# Patient Record
Sex: Female | Born: 1985 | Race: White | Hispanic: No | Marital: Married | Smoking: Never smoker
Health system: Southern US, Community
[De-identification: ages and names within clinical notes are randomized; demographics above are authoritative.]

## PROBLEM LIST (undated history)

## (undated) DIAGNOSIS — IMO0002 Reserved for concepts with insufficient information to code with codable children: Secondary | ICD-10-CM

## (undated) DIAGNOSIS — Z9889 Other specified postprocedural states: Secondary | ICD-10-CM

## (undated) DIAGNOSIS — L0591 Pilonidal cyst without abscess: Secondary | ICD-10-CM

## (undated) DIAGNOSIS — J302 Other seasonal allergic rhinitis: Secondary | ICD-10-CM

## (undated) DIAGNOSIS — R112 Nausea with vomiting, unspecified: Secondary | ICD-10-CM

## (undated) DIAGNOSIS — F419 Anxiety disorder, unspecified: Secondary | ICD-10-CM

## (undated) HISTORY — DX: Anxiety disorder, unspecified: F41.9

## (undated) HISTORY — PX: WISDOM TOOTH EXTRACTION: SHX21

## (undated) HISTORY — DX: Reserved for concepts with insufficient information to code with codable children: IMO0002

## (undated) HISTORY — PX: COLPOSCOPY: SHX161

---
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
## 2004-02-23 HISTORY — PX: SHOULDER SURGERY: SHX246

## 2004-05-05 ENCOUNTER — Other Ambulatory Visit: Admission: RE | Admit: 2004-05-05 | Discharge: 2004-05-05 | Payer: Self-pay | Admitting: Gynecology

## 2004-08-07 ENCOUNTER — Other Ambulatory Visit: Admission: RE | Admit: 2004-08-07 | Discharge: 2004-08-07 | Payer: Self-pay | Admitting: Gynecology

## 2005-05-12 ENCOUNTER — Other Ambulatory Visit: Admission: RE | Admit: 2005-05-12 | Discharge: 2005-05-12 | Payer: Self-pay | Admitting: Gynecology

## 2005-11-16 ENCOUNTER — Encounter: Admission: RE | Admit: 2005-11-16 | Discharge: 2005-11-16 | Payer: Self-pay | Admitting: Sports Medicine

## 2005-11-19 ENCOUNTER — Ambulatory Visit: Payer: Self-pay | Admitting: Sports Medicine

## 2006-05-11 ENCOUNTER — Ambulatory Visit: Payer: Self-pay | Admitting: Internal Medicine

## 2006-05-16 ENCOUNTER — Other Ambulatory Visit: Admission: RE | Admit: 2006-05-16 | Discharge: 2006-05-16 | Payer: Self-pay | Admitting: Gynecology

## 2006-09-12 ENCOUNTER — Ambulatory Visit: Payer: Self-pay | Admitting: Internal Medicine

## 2006-09-14 ENCOUNTER — Telehealth: Payer: Self-pay | Admitting: Internal Medicine

## 2006-11-25 DIAGNOSIS — R519 Headache, unspecified: Secondary | ICD-10-CM | POA: Insufficient documentation

## 2006-11-25 DIAGNOSIS — R51 Headache: Secondary | ICD-10-CM

## 2007-01-24 ENCOUNTER — Ambulatory Visit: Payer: Self-pay | Admitting: Internal Medicine

## 2007-01-24 DIAGNOSIS — B079 Viral wart, unspecified: Secondary | ICD-10-CM | POA: Insufficient documentation

## 2007-02-27 ENCOUNTER — Ambulatory Visit: Payer: Self-pay | Admitting: Internal Medicine

## 2007-03-30 ENCOUNTER — Ambulatory Visit: Payer: Self-pay | Admitting: Internal Medicine

## 2007-03-30 DIAGNOSIS — M1990 Unspecified osteoarthritis, unspecified site: Secondary | ICD-10-CM | POA: Insufficient documentation

## 2007-05-25 ENCOUNTER — Other Ambulatory Visit: Admission: RE | Admit: 2007-05-25 | Discharge: 2007-05-25 | Payer: Self-pay | Admitting: Gynecology

## 2008-02-02 ENCOUNTER — Other Ambulatory Visit: Admission: RE | Admit: 2008-02-02 | Discharge: 2008-02-02 | Payer: Self-pay | Admitting: Gynecology

## 2008-02-02 ENCOUNTER — Ambulatory Visit: Payer: Self-pay | Admitting: Women's Health

## 2008-02-02 ENCOUNTER — Encounter: Payer: Self-pay | Admitting: Women's Health

## 2008-09-04 ENCOUNTER — Other Ambulatory Visit: Admission: RE | Admit: 2008-09-04 | Discharge: 2008-09-04 | Payer: Self-pay | Admitting: Gynecology

## 2008-09-04 ENCOUNTER — Encounter: Payer: Self-pay | Admitting: Women's Health

## 2008-09-04 ENCOUNTER — Ambulatory Visit: Payer: Self-pay | Admitting: Women's Health

## 2009-02-24 ENCOUNTER — Telehealth: Payer: Self-pay | Admitting: Internal Medicine

## 2009-02-24 ENCOUNTER — Ambulatory Visit: Payer: Self-pay | Admitting: Internal Medicine

## 2009-02-24 DIAGNOSIS — J0110 Acute frontal sinusitis, unspecified: Secondary | ICD-10-CM

## 2009-02-24 LAB — CONVERTED CEMR LAB: Rapid Strep: NEGATIVE

## 2009-02-27 ENCOUNTER — Other Ambulatory Visit: Admission: RE | Admit: 2009-02-27 | Discharge: 2009-02-27 | Payer: Self-pay | Admitting: Gynecology

## 2009-02-27 ENCOUNTER — Ambulatory Visit: Payer: Self-pay | Admitting: Women's Health

## 2009-09-05 ENCOUNTER — Ambulatory Visit: Payer: Self-pay | Admitting: Women's Health

## 2009-09-05 ENCOUNTER — Other Ambulatory Visit: Admission: RE | Admit: 2009-09-05 | Discharge: 2009-09-05 | Payer: Self-pay | Admitting: Gynecology

## 2009-09-19 ENCOUNTER — Ambulatory Visit: Payer: Self-pay | Admitting: Gynecology

## 2010-03-24 NOTE — Assessment & Plan Note (Signed)
Summary: sore throat and cough/bmw   Vital Signs:  Patient profile:   25 year old female Height:      70 inches Weight:      175 pounds BMI:     25.20 Temp:     98.2 degrees F oral Pulse rate:   76 / minute Resp:     14 per minute BP sitting:   110 / 70  (left arm)  Vitals Entered By: Willy Eddy, LPN (February 24, 2009 4:21 PM) CC: c/o sore throawt, low grade fever, and head congestion, URI symptoms   CC:  c/o sore throawt, low grade fever, and head congestion, and URI symptoms.  History of Present Illness:  URI Symptoms      This is a 25 year old woman who presents with URI symptoms.  The patient reports nasal congestion and earache, but denies clear nasal discharge, purulent nasal discharge, sore throat, dry cough, productive cough, and sick contacts.  The patient denies fever, low-grade fever (<100.5 degrees), fever of 100.5-103 degrees, fever of 103.1-104 degrees, fever to >104 degrees, stiff neck, dyspnea, wheezing, rash, vomiting, diarrhea, use of an antipyretic, and response to antipyretic.  The patient also reports itchy throat, headache, and muscle aches.  The patient denies the following risk factors for Strep sinusitis: unilateral facial pain, unilateral nasal discharge, poor response to decongestant, double sickening, tooth pain, Strep exposure, tender adenopathy, and absence of cough.    Preventive Screening-Counseling & Management  Alcohol-Tobacco     Smoking Status: never  Allergies: No Known Drug Allergies  Past History:  Family History: Last updated: 11/25/2006 Family History Breast cancer 1st degree relative <50 Family History of Colon CA 1st degree relative <60 Family History Diabetes 1st degree relative Family History Hypertension Family History Lung cancer Family History of Cardiovascular disorder  Social History: Last updated: 11/25/2006 Single Never Smoked Alcohol use-no Drug use-no Regular exercise-yes  Risk Factors: Exercise: yes  (11/25/2006)  Risk Factors: Smoking Status: never (02/24/2009)  Past medical, surgical, family and social histories (including risk factors) reviewed, and no changes noted (except as noted below).  Past Medical History: Reviewed history from 03/30/2007 and no changes required. Allergies Sinusitis Headache Anxiety Osteoarthritis  Past Surgical History: Reviewed history from 11/25/2006 and no changes required. Rotator cuff repair  Family History: Reviewed history from 11/25/2006 and no changes required. Family History Breast cancer 1st degree relative <50 Family History of Colon CA 1st degree relative <60 Family History Diabetes 1st degree relative Family History Hypertension Family History Lung cancer Family History of Cardiovascular disorder  Social History: Reviewed history from 11/25/2006 and no changes required. Single Never Smoked Alcohol use-no Drug use-no Regular exercise-yes  Review of Systems       The patient complains of hoarseness, prolonged cough, and headaches.  The patient denies anorexia, fever, weight loss, weight gain, vision loss, decreased hearing, chest pain, syncope, dyspnea on exertion, peripheral edema, hemoptysis, abdominal pain, melena, hematochezia, severe indigestion/heartburn, hematuria, incontinence, genital sores, muscle weakness, suspicious skin lesions, transient blindness, difficulty walking, depression, unusual weight change, abnormal bleeding, enlarged lymph nodes, angioedema, breast masses, and testicular masses.    Physical Exam  General:  Well-developed,well-nourished,in no acute distress; alert,appropriate and cooperative throughout examination Head:  normocephalic and atraumatic.   Eyes:  pupils equal and pupils round.   Ears:  R ear normal and L ear normal.   Nose:  no external deformity and no nasal discharge.   Neck:  No deformities, masses, or tenderness noted. Lungs:  normal respiratory effort and no wheezes.   Heart:   normal rate and regular rhythm.   Abdomen:  soft, non-tender, and normal bowel sounds.   Msk:  normal ROM and no joint tenderness.     Impression & Recommendations:  Problem # 1:  ACUTE FRONTAL SINUSITIS (ICD-461.1)  Instructed on treatment. Call if symptoms persist or worsen.   Her updated medication list for this problem includes:    Azithromycin 250 Mg Tabs (Azithromycin) .[name]..[name] Two by mouth now the one by mouth daily for 4 additional days  Complete Medication List: 1)  Zoloft 25 Mg Tabs (Sertraline hcl) .[name].. 1 once daily 2)  Ortho Tri-cyclen (28) 0.035 Mg Tabs (Norgestimate-ethinyl estradiol) .... As directed 3)  Azithromycin 250 Mg Tabs (Azithromycin) .... Two by mouth now the one by mouth daily for 4 additional days  Other Orders: Rapid Strep (16109)  Patient Instructions: 1)  get 14 zyrtec d one by mouth two times a day 2)  take the full course of the antibiotic Prescriptions: AZITHROMYCIN 250 MG TABS (AZITHROMYCIN) two by mouth now the one by mouth daily for 4 additional days  #6 x 0   Entered and Authorized by:   Stacie Glaze MD   Signed by:   Stacie Glaze MD on 02/24/2009   Method used:   Electronically to        [facility] [phone]* (retail)       [address]       [place], [place]  [address]       Ph: [phone] or [phone]       Fax: [phone]   RxID:   [id number]   Laboratory Results    Other Tests  Rapid Strep: negative Comments: Rita Ohara  February 24, 2009 4:08 PM   Kit Test Internal QC: Negative   (Normal Range: Negative)

## 2010-03-24 NOTE — Progress Notes (Signed)
Summary: Needs to be worked in   Advice worker from Patient Call back at [phone]   Caller: Patient Summary of Call: Pt thinks she has sinus infection or strep throat, would like to be seen this afternoon. Initial call taken by: Trixie Dredge,  February 24, 2009 12:10 PM  Follow-up for Phone Call        acute quick work in may come at 4 pm go to lab for strept first Follow-up by: Stacie Glaze MD,  February 24, 2009 1:02 PM  Additional Follow-up for Phone Call Additional follow up Details #1::        pt ionformed Additional Follow-up by: Willy Eddy, LPN,  February 24, 2009 1:07 PM

## 2010-03-24 NOTE — Assessment & Plan Note (Signed)
Summary: DRY SKIN/RASH AROUND CUTICLE BIL THUMBS/NN   Vital Signs:  Patient Profile:   25 Years Old Female Weight:      171 pounds Temp:     97.7 degrees F oral BP sitting:   106 / 60  (right arm)  Vitals Entered By: Raechel Ache, RN (September 12, 2006 9:32 AM)             Is Patient Diabetic? No   Procedure Note  Wart Removal: Indication: inflamed lesion Consent signed: no    Location: left hand    # lesions removed: 6    Technique: cryotherapy    Comment: 6 periungual warts from about her left index finger and second and third fingers of the right hand were treated with cryotherapy  Additional Instructions: call if signs of infection   Chief Complaint:  Rash/abnormal skin around cuticles on thumbs.  History of Present Illness: patients in today complaining of several lesions about the fingers. Clinical exam revealed a several periungual warts      Risk Factors:  Tobacco use:  never    Physical Exam  Skin:     several periungual warts were identified    Impression & Recommendations:  Problem # 1:  periungual warts treated with cryotherapy   Patient Instructions: 1)  call a signs of infection.  Return p.r.n.

## 2010-05-01 ENCOUNTER — Ambulatory Visit: Payer: Self-pay | Admitting: Gynecology

## 2010-05-12 ENCOUNTER — Other Ambulatory Visit (HOSPITAL_COMMUNITY)
Admission: RE | Admit: 2010-05-12 | Discharge: 2010-05-12 | Disposition: A | Discharge status: Home / Self Care | Payer: BC Managed Care – PPO | Source: Ambulatory Visit | Attending: Gynecology | Admitting: Gynecology

## 2010-05-12 ENCOUNTER — Ambulatory Visit (INDEPENDENT_AMBULATORY_CARE_PROVIDER_SITE_OTHER): Payer: BC Managed Care – PPO | Admitting: Gynecology

## 2010-05-12 ENCOUNTER — Other Ambulatory Visit: Payer: Self-pay | Admitting: Gynecology

## 2010-05-12 ENCOUNTER — Telehealth: Payer: Self-pay | Admitting: *Deleted

## 2010-05-12 ENCOUNTER — Other Ambulatory Visit: Payer: Self-pay | Admitting: *Deleted

## 2010-05-12 DIAGNOSIS — R87619 Unspecified abnormal cytological findings in specimens from cervix uteri: Secondary | ICD-10-CM | POA: Insufficient documentation

## 2010-05-12 DIAGNOSIS — R42 Dizziness and giddiness: Secondary | ICD-10-CM

## 2010-05-12 DIAGNOSIS — N870 Mild cervical dysplasia: Secondary | ICD-10-CM

## 2010-05-12 MED ORDER — SCOPOLAMINE 1 MG/3DAYS TD PT72: 1.0000 | MEDICATED_PATCH | TRANSDERMAL | Status: DC

## 2010-05-12 NOTE — Telephone Encounter (Signed)
incomplete phone number from today--last ov 1-11--requesting scopalamine patches for 2 week cruise

## 2010-05-12 NOTE — Telephone Encounter (Signed)
May have number 5

## 2010-05-12 NOTE — Telephone Encounter (Signed)
Last ov 02-2009---requesting scopalamine patches for 2 week cruise

## 2010-07-06 ENCOUNTER — Ambulatory Visit (INDEPENDENT_AMBULATORY_CARE_PROVIDER_SITE_OTHER): Payer: BC Managed Care – PPO | Admitting: Women's Health

## 2010-07-06 DIAGNOSIS — N898 Other specified noninflammatory disorders of vagina: Secondary | ICD-10-CM

## 2010-07-06 DIAGNOSIS — B373 Candidiasis of vulva and vagina: Secondary | ICD-10-CM

## 2010-09-07 ENCOUNTER — Other Ambulatory Visit (HOSPITAL_COMMUNITY)
Admission: RE | Admit: 2010-09-07 | Discharge: 2010-09-07 | Disposition: A | Discharge status: Home / Self Care | Payer: BC Managed Care – PPO | Source: Ambulatory Visit | Attending: Gynecology | Admitting: Gynecology

## 2010-09-07 ENCOUNTER — Encounter (INDEPENDENT_AMBULATORY_CARE_PROVIDER_SITE_OTHER): Payer: BC Managed Care – PPO | Admitting: Women's Health

## 2010-09-07 ENCOUNTER — Other Ambulatory Visit: Payer: Self-pay | Admitting: Women's Health

## 2010-09-07 DIAGNOSIS — Z01419 Encounter for gynecological examination (general) (routine) without abnormal findings: Secondary | ICD-10-CM

## 2010-09-07 DIAGNOSIS — R809 Proteinuria, unspecified: Secondary | ICD-10-CM

## 2010-09-07 DIAGNOSIS — Z113 Encounter for screening for infections with a predominantly sexual mode of transmission: Secondary | ICD-10-CM

## 2010-09-07 DIAGNOSIS — Z124 Encounter for screening for malignant neoplasm of cervix: Secondary | ICD-10-CM | POA: Insufficient documentation

## 2010-09-28 ENCOUNTER — Other Ambulatory Visit: Payer: Self-pay | Admitting: *Deleted

## 2010-09-28 MED ORDER — NORGESTIMATE-ETH ESTRADIOL 0.25-35 MG-MCG PO TABS: 1.0000 | ORAL_TABLET | Freq: Every day | ORAL | Status: DC

## 2010-09-28 MED ORDER — NORGESTIM-ETH ESTRAD TRIPHASIC 0.18/0.215/0.25 MG-25 MCG PO TABS: 1.0000 | ORAL_TABLET | Freq: Every day | ORAL | Status: DC

## 2010-09-28 NOTE — Telephone Encounter (Signed)
Addended by: Venora Maples on: 09/28/2010 04:55 PM   Modules accepted: Orders

## 2011-02-24 ENCOUNTER — Ambulatory Visit (INDEPENDENT_AMBULATORY_CARE_PROVIDER_SITE_OTHER): Payer: BC Managed Care – PPO | Admitting: Women's Health

## 2011-02-24 ENCOUNTER — Encounter: Payer: Self-pay | Admitting: Women's Health

## 2011-02-24 DIAGNOSIS — N870 Mild cervical dysplasia: Secondary | ICD-10-CM

## 2011-02-24 NOTE — Progress Notes (Signed)
Patient ID: [name], female   DOB: 1986-01-26, 26 y.o.   MRN: [id number] Presents for repeat Pap. 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 Last 2 Paps normal. Contracepting on Ortho Tri-Cyclen without complaint/same partner. Gardasil completed in 07  Exam: External genitalia  within normal limits speculum exam cervix is pink without lesion or discharge repeat Pap taken. Bimanual no CMT or adnexal fullness or tenderness.  Plan: If Pap normal will resume to annual screening.

## 2011-03-18 ENCOUNTER — Ambulatory Visit (INDEPENDENT_AMBULATORY_CARE_PROVIDER_SITE_OTHER): Payer: BC Managed Care – PPO | Admitting: Gynecology

## 2011-03-18 ENCOUNTER — Encounter: Payer: Self-pay | Admitting: Gynecology

## 2011-03-18 DIAGNOSIS — K602 Anal fissure, unspecified: Secondary | ICD-10-CM

## 2011-03-18 MED ORDER — FLUCONAZOLE 200 MG PO TABS: 200.0000 mg | ORAL_TABLET | Freq: Every day | ORAL | Status: AC

## 2011-03-18 MED ORDER — CEPHALEXIN 500 MG PO CAPS: 500.0000 mg | ORAL_CAPSULE | Freq: Four times a day (QID) | ORAL | Status: AC

## 2011-03-18 NOTE — Progress Notes (Signed)
Patient presents with approximately one-month history of upper gluteal fold irritation. She thought it was due to wearing her thong and stopped wearing it. She's been treating the area with a combination of Vaseline, hydrocortisone cream and Neosporin type ointment. It seems to have progressively gotten worse and now she noticed some bleeding and presents for evaluation. She has no fevers, chills or constitutional symptoms. No GI symptoms such as diarrhea, abdominal cramping or constipation. No urinary symptoms. She currently is on her menses.  Exam was [name] chaperone present Perineum with fairly deep fissure superior to the anus in the gluteal fold approximately 2 cm in length and 1 cm deep with apparent fresh granulation tissue lining the sides. Some inflammatory changes in the fold above this. All areas moist and red although not consistent with cellulitis but more irritation. I probed the area with a Q-tip and there is no obvious fistulous tract or overt draining material.  Rectal exam is normal without evidence of perirectal masses tenderness or abscess. Otherwise external genital exam is normal with several small pimples in the labia majora but no other abnormalities. No inguinal adenopathy or tenderness. Vaginal exam was not performed due to her menses and tampon in place.  Assessment and plan: Impressive gluteal fissure, questionable pilonidal cyst breakdown. No overt fistulous tract to suggest perirectal fistulas. No bowel changes to suggest Crohn's. Will start on Keflex 4 times a day x7 days and sitz baths with hairdryer drying afterwards 4 times a day. Diflucan daily for 5 days to cover cutaneous yeast as it does look moist and red.  Lastly arrange general surgical consult over the next day or so. Patient understands and agrees with the plan.

## 2011-03-18 NOTE — Patient Instructions (Signed)
Take antibiotics as prescribed and do sits baths with hairdryer drying afterwards 4 times daily. Follow up with Gen. Surgical consult as arranged.

## 2011-03-22 ENCOUNTER — Other Ambulatory Visit: Payer: Self-pay | Admitting: *Deleted

## 2011-03-22 ENCOUNTER — Telehealth: Payer: Self-pay | Admitting: *Deleted

## 2011-03-22 DIAGNOSIS — K602 Anal fissure, unspecified: Secondary | ICD-10-CM

## 2011-03-22 NOTE — Telephone Encounter (Signed)
Patient informed information given phone number if needs to reschedule.

## 2011-03-22 NOTE — Telephone Encounter (Signed)
Lm for patient to call.  Info on appt set up with Dr. Luisa Hart on 04/02/11 @ 9:45am.

## 2011-03-22 NOTE — Telephone Encounter (Signed)
Message copied by Libby Maw on Mon Mar 22, 2011  3:04 PM ------      Message from: Colin Broach P      Created: Thu Mar 18, 2011  4:40 PM       Kristyne Woodring, patient needs general surgical referral preferably Friday reference gluteal fissure questionable pilonidal cyst breakdown. They can access my note for the particulars

## 2011-03-31 ENCOUNTER — Telehealth: Payer: Self-pay | Admitting: *Deleted

## 2011-03-31 NOTE — Telephone Encounter (Signed)
Pt was called to follow up from her visit 2 weeks ago. She states she is somewhat better. And has finished rx Dr Audie Box gave her and has been doing sitz baths daily and will follow up with surgeon on Friday 04/02/11.  Pt thanked me for calling.

## 2011-04-02 ENCOUNTER — Encounter (INDEPENDENT_AMBULATORY_CARE_PROVIDER_SITE_OTHER): Payer: Self-pay | Admitting: Surgery

## 2011-04-02 ENCOUNTER — Ambulatory Visit (INDEPENDENT_AMBULATORY_CARE_PROVIDER_SITE_OTHER): Payer: BC Managed Care – PPO | Admitting: Surgery

## 2011-04-02 VITALS — BP 108/70 | HR 76 | Temp 98.6°F | Resp 12 | Ht 70.0 in | Wt 204.4 lb

## 2011-04-02 DIAGNOSIS — L988 Other specified disorders of the skin and subcutaneous tissue: Secondary | ICD-10-CM

## 2011-04-02 NOTE — Patient Instructions (Signed)
Pilonidal Cyst A pilonidal cyst occurs when hairs get trapped (ingrown) beneath the skin in the crease between the buttocks over your sacrum (the bone under that crease). Pilonidal cysts are most common in young men with a lot of body hair. When the cyst is ruptured (breaks) or leaking, fluid from the cyst may cause burning and itching. If the cyst becomes infected, it causes a painful swelling filled with pus (abscess). The pus and trapped hairs need to be removed (often by lancing) so that the infection can heal. However, recurrence is common and an operation may be needed to remove the cyst. HOME CARE INSTRUCTIONS   If the cyst was NOT INFECTED:   Keep the area clean and dry. Bathe or shower daily. Wash the area well with a germ-killing soap. Warm tub baths may help prevent infection and help with drainage. Dry the area well with a towel.   Avoid tight clothing to keep area as moisture free as possible.   Keep area between buttocks as free of hair as possible. A depilatory may be used.   If the cyst WAS INFECTED and needed to be drained:   Your caregiver packed the wound with gauze to keep the wound open. This allows the wound to heal from the inside outwards and continue draining.   Return for a wound check in 1 day or as suggested.   If you take tub baths or showers, repack the wound with gauze following them. Sponge baths (at the sink) are a good alternative.   If an antibiotic was ordered to fight the infection, take as directed.   Only take over-the-counter or prescription medicines for pain, discomfort, or fever as directed by your caregiver.   After the drain is removed, use sitz baths for 20 minutes 4 times per day. Clean the wound gently with mild unscented soap, pat dry, and then apply a dry dressing.  SEEK MEDICAL CARE IF:   You have increased pain, swelling, redness, drainage, or bleeding from the area.   You have a fever.   You have muscles aches, dizziness, or a  general ill feeling.  Document Released: 02/06/2000 Document Revised: 10/21/2010 Document Reviewed: 04/05/2008 [employer] Patient Information 2012 [employer]. 

## 2011-04-02 NOTE — Progress Notes (Signed)
Patient ID: [name], female   DOB: 1985-08-21, 26 y.o.   MRN: [id number]  Chief Complaint  Patient presents with  . Other    new pt- eval gluteal fissure    HPI [name] is a 26 y.o. female.   HPIThe patient presents with the complaint of cyst of her tailbone. For one month she had an area over her tailbone and opened and drained. She was placed on antibiotics. She is applying saline to the wound over her tailbone. She has no more pain today. And the drainage stopped. She denies any fever or chills. She is sent at the request of Dr. Lovell Sheehan.  Past Medical History  Diagnosis Date  . LGSIL (low grade squamous intraepithelial dysplasia) 2006    c&b  . History of colposcopy with cervical biopsy 2009    lgsil  . 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 Rubella immune 2010  . Anxiety     Past Surgical History  Procedure Date  . Shoulder surgery 2006    right    Family History  Problem Relation Age of Onset  . Cancer Mother     breast  . Hypertension Father   . Diabetes Maternal Grandmother   . Osteoporosis Maternal Grandmother   . Heart disease Maternal Grandfather   . Diabetes Paternal Grandmother   . Rectal cancer Paternal Grandmother   . Cancer Paternal Grandmother     rectal  . Heart disease Paternal Grandfather   . Lung cancer Paternal Grandfather   . Cancer Paternal Grandfather     lung    Social History History  Substance Use Topics  . Smoking status: Never Smoker   . Smokeless tobacco: Not on file  . Alcohol Use: No    No Known Allergies  Current Outpatient Prescriptions  Medication Sig Dispense Refill  . Cetirizine-Pseudoephedrine (ZYRTEC-D PO) Take by mouth as needed.        . Norgestimate-Ethinyl Estradiol Triphasic (ORTHO TRI-CYCLEN LO) 0.18/0.215/0.25 MG-25 MCG tablet Take 1 tablet by mouth daily.  1 Package  11  . sertraline (ZOLOFT) 50 MG tablet Take 50 mg by mouth daily.          Review of Systems Review of Systems  Constitutional:  Negative for fever, chills and unexpected weight change.  HENT: Negative for hearing loss, congestion, sore throat, trouble swallowing and voice change.   Eyes: Negative for visual disturbance.  Respiratory: Negative for cough and wheezing.   Cardiovascular: Negative for chest pain, palpitations and leg swelling.  Gastrointestinal: Negative for nausea, vomiting, abdominal pain, diarrhea, constipation, blood in stool, abdominal distention and anal bleeding.  Genitourinary: Negative for hematuria, vaginal bleeding and difficulty urinating.  Musculoskeletal: Negative for arthralgias.  Skin: Negative for rash and wound.  Neurological: Negative for seizures, syncope and headaches.  Hematological: Negative for adenopathy. Does not bruise/bleed easily.  Psychiatric/Behavioral: Negative for confusion.    Blood pressure 108/70, pulse 76, temperature 98.6 F (37 C), temperature source Temporal, resp. rate 12, height 5\' 10"  (1.778 m), weight 204 lb 6.4 oz (92.715 kg), last menstrual period 03/18/2011.  Physical Exam Physical Exam  Constitutional: She is oriented to person, place, and time. She appears well-developed and well-nourished.  HENT:  Head: Normocephalic and atraumatic.  Eyes: EOM are normal. Pupils are equal, round, and reactive to light.  Neck: Normal range of motion. Neck supple.  Musculoskeletal: Normal range of motion.  Neurological: She is alert and oriented to person, place, and time.  Skin:  Psychiatric: She has a normal mood and affect. Her behavior is normal. Judgment and thought content normal.    Data Reviewed   Assessment    Pilonidal disease    Plan    Her wound is now open and clean. There is no signs of any active drainage. It is relatively large. I will see her back in 3 weeks to recheck her. This will require excision at some point once he acute inflammation settled down.       [name] A. 04/02/2011, 10:56 AM

## 2011-04-26 ENCOUNTER — Encounter (INDEPENDENT_AMBULATORY_CARE_PROVIDER_SITE_OTHER): Payer: BC Managed Care – PPO | Admitting: Surgery

## 2011-05-17 ENCOUNTER — Encounter (INDEPENDENT_AMBULATORY_CARE_PROVIDER_SITE_OTHER): Payer: BC Managed Care – PPO | Admitting: Surgery

## 2011-06-15 ENCOUNTER — Ambulatory Visit (INDEPENDENT_AMBULATORY_CARE_PROVIDER_SITE_OTHER): Payer: BC Managed Care – PPO | Admitting: Surgery

## 2011-06-15 ENCOUNTER — Encounter (INDEPENDENT_AMBULATORY_CARE_PROVIDER_SITE_OTHER): Payer: Self-pay | Admitting: Surgery

## 2011-06-15 ENCOUNTER — Encounter (INDEPENDENT_AMBULATORY_CARE_PROVIDER_SITE_OTHER): Payer: BC Managed Care – PPO | Admitting: Surgery

## 2011-06-15 DIAGNOSIS — L0591 Pilonidal cyst without abscess: Secondary | ICD-10-CM

## 2011-06-15 NOTE — Patient Instructions (Signed)
Return 1 month

## 2011-06-15 NOTE — Progress Notes (Signed)
Patient ID: [name], female   DOB: [birth year], 26 y.o.   MRN: [id number]  No chief complaint on file.   HPI [name] is a 26 y.o. female.   HPIThe patient presents with the complaint of cyst of her tailbone. For one month she had an area over her tailbone and opened and drained. She was placed on antibiotics. She is applying saline to the wound over her tailbone. She has no more pain today. And the drainage stopped. She denies any fever or chills. She is sent at the request of Dr. Lovell Sheehan. She was seen two months ago.  No pain but continues to have drainage.  Past Medical History  Diagnosis Date  . LGSIL (low grade squamous intraepithelial dysplasia) 2006    c&b  . History of colposcopy with cervical biopsy 2009    lgsil  . 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 Rubella immune 2010  . Anxiety     Past Surgical History  Procedure Date  . Shoulder surgery 2006    right    Family History  Problem Relation Age of Onset  . Cancer Mother     breast  . Hypertension Father   . Diabetes Maternal Grandmother   . Osteoporosis Maternal Grandmother   . Heart disease Maternal Grandfather   . Diabetes Paternal Grandmother   . Rectal cancer Paternal Grandmother   . Cancer Paternal Grandmother     rectal  . Heart disease Paternal Grandfather   . Lung cancer Paternal Grandfather   . Cancer Paternal Grandfather     lung    Social History History  Substance Use Topics  . Smoking status: Never Smoker   . Smokeless tobacco: Not on file  . Alcohol Use: No    No Known Allergies  Current Outpatient Prescriptions  Medication Sig Dispense Refill  . Cetirizine-Pseudoephedrine (ZYRTEC-D PO) Take by mouth as needed.        . Norgestimate-Ethinyl Estradiol Triphasic (ORTHO TRI-CYCLEN LO) 0.18/0.215/0.25 MG-25 MCG tablet Take 1 tablet by mouth daily.  1 Package  11  . sertraline (ZOLOFT) 50 MG tablet Take 50 mg by mouth daily.          Review of Systems Review of Systems    Constitutional: Negative for fever, chills and unexpected weight change.  HENT: Negative for hearing loss, congestion, sore throat, trouble swallowing and voice change.   Eyes: Negative for visual disturbance.  Respiratory: Negative for cough and wheezing.   Cardiovascular: Negative for chest pain, palpitations and leg swelling.  Gastrointestinal: Negative for nausea, vomiting, abdominal pain, diarrhea, constipation, blood in stool, abdominal distention and anal bleeding.  Genitourinary: Negative for hematuria, vaginal bleeding and difficulty urinating.  Musculoskeletal: Negative for arthralgias.  Skin: Negative for rash and wound.  Neurological: Negative for seizures, syncope and headaches.  Hematological: Negative for adenopathy. Does not bruise/bleed easily.  Psychiatric/Behavioral: Negative for confusion.    Blood pressure 110/74, pulse 80, height 5\' 10"  (1.778 m), weight 201 lb 6.4 oz (91.354 kg).  Physical Exam Physical Exam  Constitutional: She is oriented to person, place, and time. She appears well-developed and well-nourished.  HENT:  Head: Normocephalic and atraumatic.  Eyes: EOM are normal. Pupils are equal, round, and reactive to light.  Neck: Normal range of motion. Neck supple.  Musculoskeletal: Normal range of motion.  Neurological: She is alert and oriented to person, place, and time.  Skin:  Multiple sinus tracts on two open wounds in gluteal cleft.  Mild drainage no cellulitis.  Psychiatric: She has a normal mood and affect. Her behavior is normal. Judgment and thought content normal.    Data Reviewed   Assessment    Pilonidal disease    Plan    Return 1 month to set up excision once school year is over.         [name] A. 06/15/2011, 4:48 PM

## 2011-07-20 ENCOUNTER — Encounter (INDEPENDENT_AMBULATORY_CARE_PROVIDER_SITE_OTHER): Payer: Self-pay | Admitting: Surgery

## 2011-07-20 ENCOUNTER — Ambulatory Visit (INDEPENDENT_AMBULATORY_CARE_PROVIDER_SITE_OTHER): Payer: BC Managed Care – PPO | Admitting: Surgery

## 2011-07-20 DIAGNOSIS — L0591 Pilonidal cyst without abscess: Secondary | ICD-10-CM

## 2011-07-20 NOTE — Progress Notes (Signed)
Patient ID: [name], female   DOB: [birth year], 26 y.o.   MRN: [id number]  No chief complaint on file.   HPI [name] is a 25 y.o. female.   HPIThe patient presents with the complaint of cyst of her tailbone. For three  month she had an area over her tailbone and opened and drained. She was placed on antibiotics. She is applying saline to the wound over her tailbone. She has no more pain today. And the drainage stopped. She denies any fever or chills. She is sent at the request of Dr. Lovell Sheehan. She was seen two months ago.  No pain but continues to have drainage.  Past Medical History  Diagnosis Date  . LGSIL (low grade squamous intraepithelial dysplasia) 2006    c&b  . History of colposcopy with cervical biopsy 2009    lgsil  . 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 Rubella immune 2010  . Anxiety     Past Surgical History  Procedure Date  . Shoulder surgery 2006    right    Family History  Problem Relation Age of Onset  . Cancer Mother     breast  . Hypertension Father   . Diabetes Maternal Grandmother   . Osteoporosis Maternal Grandmother   . Heart disease Maternal Grandfather   . Diabetes Paternal Grandmother   . Rectal cancer Paternal Grandmother   . Cancer Paternal Grandmother     rectal  . Heart disease Paternal Grandfather   . Lung cancer Paternal Grandfather   . Cancer Paternal Grandfather     lung    Social History History  Substance Use Topics  . Smoking status: Never Smoker   . Smokeless tobacco: Not on file  . Alcohol Use: No    No Known Allergies  Current Outpatient Prescriptions  Medication Sig Dispense Refill  . Cetirizine-Pseudoephedrine (ZYRTEC-D PO) Take by mouth as needed.        . Norgestimate-Ethinyl Estradiol Triphasic (ORTHO TRI-CYCLEN LO) 0.18/0.215/0.25 MG-25 MCG tablet Take 1 tablet by mouth daily.  1 Package  11  . sertraline (ZOLOFT) 50 MG tablet Take 50 mg by mouth daily.          Review of Systems Review of Systems    Constitutional: Negative for fever, chills and unexpected weight change.  HENT: Negative for hearing loss, congestion, sore throat, trouble swallowing and voice change.   Eyes: Negative for visual disturbance.  Respiratory: Negative for cough and wheezing.   Cardiovascular: Negative for chest pain, palpitations and leg swelling.  Gastrointestinal: Negative for nausea, vomiting, abdominal pain, diarrhea, constipation, blood in stool, abdominal distention and anal bleeding.  Genitourinary: Negative for hematuria, vaginal bleeding and difficulty urinating.  Musculoskeletal: Negative for arthralgias.  Skin: Negative for rash and wound.  Neurological: Negative for seizures, syncope and headaches.  Hematological: Negative for adenopathy. Does not bruise/bleed easily.  Psychiatric/Behavioral: Negative for confusion.    Blood pressure 120/82, pulse 80, temperature 97.8 F (36.6 C), height 5\' 10"  (1.778 m), weight 200 lb (90.719 kg).  Physical Exam Physical Exam  Constitutional: She is oriented to person, place, and time. She appears well-developed and well-nourished.  HENT:  Head: Normocephalic and atraumatic.  Eyes: EOM are normal. Pupils are equal, round, and reactive to light.  Neck: Normal range of motion. Neck supple.  Musculoskeletal: Normal range of motion.  Neurological: She is alert and oriented to person, place, and time.  Skin:  Multiple sinus tracts on two open wounds in gluteal cleft.  Mild drainage no cellulitis.   Psychiatric: She has a normal mood and affect. Her behavior is normal. Judgment and thought content normal.    Data Reviewed   Assessment    Pilonidal disease    Plan    Excision pilonidal cyst.   Risks include bleeding, infection, poor wound healing, organ injury, the need for more surgery, and recurrence. She agrees to proceed.       Karene Bracken A. 07/20/2011, 5:19 PM

## 2011-07-20 NOTE — Patient Instructions (Signed)
Pilonidal Cyst A pilonidal cyst occurs when hairs get trapped (ingrown) beneath the skin in the crease between the buttocks over your sacrum (the bone under that crease). Pilonidal cysts are most common in young men with a lot of body hair. When the cyst is ruptured (breaks) or leaking, fluid from the cyst may cause burning and itching. If the cyst becomes infected, it causes a painful swelling filled with pus (abscess). The pus and trapped hairs need to be removed (often by lancing) so that the infection can heal. However, recurrence is common and an operation may be needed to remove the cyst. HOME CARE INSTRUCTIONS   If the cyst was NOT INFECTED:   Keep the area clean and dry. Bathe or shower daily. Wash the area well with a germ-killing soap. Warm tub baths may help prevent infection and help with drainage. Dry the area well with a towel.   Avoid tight clothing to keep area as moisture free as possible.   Keep area between buttocks as free of hair as possible. A depilatory may be used.   If the cyst WAS INFECTED and needed to be drained:   Your caregiver packed the wound with gauze to keep the wound open. This allows the wound to heal from the inside outwards and continue draining.   Return for a wound check in 1 day or as suggested.   If you take tub baths or showers, repack the wound with gauze following them. Sponge baths (at the sink) are a good alternative.   If an antibiotic was ordered to fight the infection, take as directed.   Only take over-the-counter or prescription medicines for pain, discomfort, or fever as directed by your caregiver.   After the drain is removed, use sitz baths for 20 minutes 4 times per day. Clean the wound gently with mild unscented soap, pat dry, and then apply a dry dressing.  SEEK MEDICAL CARE IF:   You have increased pain, swelling, redness, drainage, or bleeding from the area.   You have a fever.   You have muscles aches, dizziness, or a  general ill feeling.  Document Released: 02/06/2000 Document Revised: 01/28/2011 Document Reviewed: 04/05/2008 Exit[employer] Patient Information 2012 [employer]. 

## 2011-08-23 DIAGNOSIS — L0591 Pilonidal cyst without abscess: Secondary | ICD-10-CM

## 2011-08-23 DIAGNOSIS — IMO0002 Reserved for concepts with insufficient information to code with codable children: Secondary | ICD-10-CM

## 2011-08-23 HISTORY — DX: Reserved for concepts with insufficient information to code with codable children: IMO0002

## 2011-08-23 HISTORY — DX: Pilonidal cyst without abscess: L05.91

## 2011-09-10 ENCOUNTER — Other Ambulatory Visit (HOSPITAL_COMMUNITY)
Admission: RE | Admit: 2011-09-10 | Discharge: 2011-09-10 | Disposition: A | Discharge status: Home / Self Care | Payer: BC Managed Care – PPO | Source: Ambulatory Visit | Attending: Women's Health | Admitting: Women's Health

## 2011-09-10 ENCOUNTER — Ambulatory Visit (INDEPENDENT_AMBULATORY_CARE_PROVIDER_SITE_OTHER): Payer: BC Managed Care – PPO | Admitting: Women's Health

## 2011-09-10 ENCOUNTER — Encounter: Payer: Self-pay | Admitting: Women's Health

## 2011-09-10 VITALS — BP 110/70 | Ht 68.75 in | Wt 201.0 lb

## 2011-09-10 DIAGNOSIS — Z01419 Encounter for gynecological examination (general) (routine) without abnormal findings: Secondary | ICD-10-CM | POA: Insufficient documentation

## 2011-09-10 DIAGNOSIS — IMO0001 Reserved for inherently not codable concepts without codable children: Secondary | ICD-10-CM

## 2011-09-10 DIAGNOSIS — R87810 Cervical high risk human papillomavirus (HPV) DNA test positive: Secondary | ICD-10-CM | POA: Insufficient documentation

## 2011-09-10 DIAGNOSIS — Z309 Encounter for contraceptive management, unspecified: Secondary | ICD-10-CM

## 2011-09-10 DIAGNOSIS — L0591 Pilonidal cyst without abscess: Secondary | ICD-10-CM | POA: Insufficient documentation

## 2011-09-10 LAB — CBC WITH DIFFERENTIAL/PLATELET
Basophils Relative: 0 % (ref 0–1)
HCT: 37.7 % (ref 36.0–46.0)
Hemoglobin: 12.9 g/dL (ref 12.0–15.0)
MCHC: 34.2 g/dL (ref 30.0–36.0)
Monocytes Absolute: 0.9 10*3/uL (ref 0.1–1.0)
Monocytes Relative: 9 % (ref 3–12)
Neutro Abs: 6 10*3/uL (ref 1.7–7.7)

## 2011-09-10 MED ORDER — NORGESTIMATE-ETH ESTRADIOL 0.25-35 MG-MCG PO TABS: 1.0000 | ORAL_TABLET | Freq: Every day | ORAL | Status: DC

## 2011-09-10 NOTE — Patient Instructions (Signed)

## 2011-09-10 NOTE — Progress Notes (Addendum)
[name] 1985/04/27 [id number]    History:    The patient presents for annual exam. Monthly cycles lasting 4-5 days/Ortho Tri-Cyclen/same partner. History of LGSIL 06 and 09 with C&B, normal Paps after. Gardasil completed. Tdap current. Diagnosed with pilonidal cyst a few months ago/surgery scheduled for 7-31.   Past medical history, past surgical history, family history and social history were all reviewed and documented in the EPIC chart. Mother-history of breast cancer age 32. 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  Divorce final. Rubella immune.   ROS:  A  ROS was performed and pertinent positives and negatives are included in the history.  Exam:  Filed Vitals:   09/10/11 1353  BP: 110/70    General appearance:  Normal Head/Neck:  Normal, without cervical or supraclavicular adenopathy. Thyroid:  Symmetrical, normal in size, without palpable masses or nodularity. Respiratory  Effort:  Normal  Auscultation:  Clear without wheezing or rhonchi Cardiovascular  Auscultation:  Regular rate, without rubs, murmurs or gallops  Edema/varicosities:  Not grossly evident Abdominal  Soft,nontender, without masses, guarding or rebound.  Liver/spleen:  No organomegaly noted  Hernia:  None appreciated  Skin  Inspection:  Grossly normal  Palpation:  Grossly normal Neurologic/psychiatric  Orientation:  Normal with appropriate conversation.  Mood/affect:  Normal  Genitourinary    Breasts: Examined lying and sitting.     Right: Without masses, retractions, discharge or axillary adenopathy.     Left: Without masses, retractions, discharge or axillary adenopathy.   Inguinal/mons:  Normal without inguinal adenopathy  External genitalia:  Normal  BUS/Urethra/Skene's glands:  Normal  Bladder:  Normal  Vagina:  Normal  Cervix:  Normal  Uterus:  Normal in size, shape and contour.  Midline and mobile  Adnexa/parametria:     Rt: Without masses or tenderness.   Lt: Without masses  or tenderness.  Anus and perineum: Pilonidal cyst.   Assessment/Plan:  26 y.o.  DWF G0 for annual exam with pilonidal cyst.  Pilonidal cyst/surgery 09-22-11 Hx LGSIL with C&B 06 & 09 Family hx breast cancer  Plan: CBC, UA. Pap with HR HPV done, new screening guidelines reviewed. Upcoming surgery for pilonidal cyst is scheduled for 7-31. SBE's, daily MVI, calcium rich diet, exercise, use of sunscreen encouraged. Plans to start mammograms in 2 years due to family history. Discussed BRCA testing, patient's mother declines.Orthotricyclin prescription, proper use reviewed slight risks for blood clots and strokes.  Condoms encouraged until permanent partner.  Harrington Challenger [facility], 2:46 PM 09/10/2011

## 2011-09-11 LAB — URINALYSIS W MICROSCOPIC + REFLEX CULTURE
Bacteria, UA: NONE SEEN
Crystals: NONE SEEN
Ketones, ur: NEGATIVE mg/dL
Leukocytes, UA: NEGATIVE
Nitrite: NEGATIVE
Specific Gravity, Urine: 1.019 (ref 1.005–1.030)
Squamous Epithelial / LPF: NONE SEEN
Urobilinogen, UA: 0.2 mg/dL (ref 0.0–1.0)

## 2011-09-16 ENCOUNTER — Encounter (HOSPITAL_BASED_OUTPATIENT_CLINIC_OR_DEPARTMENT_OTHER): Payer: Self-pay | Admitting: *Deleted

## 2011-09-21 ENCOUNTER — Ambulatory Visit (INDEPENDENT_AMBULATORY_CARE_PROVIDER_SITE_OTHER): Payer: BC Managed Care – PPO | Admitting: Gynecology

## 2011-09-21 ENCOUNTER — Encounter: Payer: Self-pay | Admitting: Gynecology

## 2011-09-21 DIAGNOSIS — R87612 Low grade squamous intraepithelial lesion on cytologic smear of cervix (LGSIL): Secondary | ICD-10-CM

## 2011-09-21 DIAGNOSIS — IMO0002 Reserved for concepts with insufficient information to code with codable children: Secondary | ICD-10-CM

## 2011-09-21 NOTE — Progress Notes (Signed)
Patient ID: [name], female   DOB: Jan 05, 1986, 26 y.o.   MRN: [id number] Patient presents with history of low-grade SIL in both 2006 and 2009. Follow up Pap smears since have all been normal until most recently with Pap smear 08/2011 showing low-grade SIL with positive high-risk HPV. Patient is scheduled for pilonidal cyst surgery tomorrow.  Exam of [name] Asst. External BUS vagina large gaping open area upper gluteal fold. Cervix grossly normal. Uterus normal size midline mobile nontender. Adnexa without masses or tenderness.  Colposcopy after acetic acid cleanse is adequate with small external os. No abnormalities visualized. ECC performed.  Physical Exam  Genitourinary:     Assessment and plan: 1. Low-grade SIL Pap smear positive high-risk HPV.  Colposcopy adequate with no abnormalities. ECC performed. Assuming low-grade are normal plan repeat Pap in one year. 2. Or cysts. Patient has surgery scheduled tomorrow.

## 2011-09-21 NOTE — Patient Instructions (Signed)
Follow up for biopsy results.

## 2011-09-22 ENCOUNTER — Encounter (HOSPITAL_BASED_OUTPATIENT_CLINIC_OR_DEPARTMENT_OTHER): Payer: Self-pay | Admitting: *Deleted

## 2011-09-22 ENCOUNTER — Ambulatory Visit (HOSPITAL_BASED_OUTPATIENT_CLINIC_OR_DEPARTMENT_OTHER)
Admission: RE | Admit: 2011-09-22 | Discharge: 2011-09-22 | Disposition: A | Discharge status: Home / Self Care | Payer: BC Managed Care – PPO | Source: Ambulatory Visit | Attending: Surgery | Admitting: Surgery

## 2011-09-22 ENCOUNTER — Encounter (HOSPITAL_BASED_OUTPATIENT_CLINIC_OR_DEPARTMENT_OTHER): Payer: Self-pay | Admitting: Certified Registered"

## 2011-09-22 ENCOUNTER — Encounter (HOSPITAL_BASED_OUTPATIENT_CLINIC_OR_DEPARTMENT_OTHER): Payer: Self-pay | Admitting: Surgery

## 2011-09-22 ENCOUNTER — Ambulatory Visit (HOSPITAL_BASED_OUTPATIENT_CLINIC_OR_DEPARTMENT_OTHER): Payer: BC Managed Care – PPO | Admitting: Certified Registered"

## 2011-09-22 ENCOUNTER — Encounter (HOSPITAL_BASED_OUTPATIENT_CLINIC_OR_DEPARTMENT_OTHER)
Admission: RE | Disposition: A | Discharge status: Home / Self Care | Payer: Self-pay | Source: Ambulatory Visit | Attending: Surgery

## 2011-09-22 DIAGNOSIS — L732 Hidradenitis suppurativa: Secondary | ICD-10-CM | POA: Insufficient documentation

## 2011-09-22 DIAGNOSIS — L0591 Pilonidal cyst without abscess: Secondary | ICD-10-CM

## 2011-09-22 DIAGNOSIS — B079 Viral wart, unspecified: Secondary | ICD-10-CM

## 2011-09-22 DIAGNOSIS — R51 Headache: Secondary | ICD-10-CM

## 2011-09-22 DIAGNOSIS — F411 Generalized anxiety disorder: Secondary | ICD-10-CM

## 2011-09-22 DIAGNOSIS — M1990 Unspecified osteoarthritis, unspecified site: Secondary | ICD-10-CM

## 2011-09-22 DIAGNOSIS — J0110 Acute frontal sinusitis, unspecified: Secondary | ICD-10-CM

## 2011-09-22 HISTORY — PX: PILONIDAL CYST EXCISION: SHX744

## 2011-09-22 HISTORY — DX: Nausea with vomiting, unspecified: R11.2

## 2011-09-22 HISTORY — DX: Other specified postprocedural states: Z98.890

## 2011-09-22 HISTORY — DX: Other seasonal allergic rhinitis: J30.2

## 2011-09-22 HISTORY — DX: Pilonidal cyst without abscess: L05.91

## 2011-09-22 LAB — POCT HEMOGLOBIN-HEMACUE: Hemoglobin: 14.2 g/dL (ref 12.0–15.0)

## 2011-09-22 SURGERY — EXCISION, PILONIDAL CYST, EXTENSIVE
Anesthesia: General | Site: Coccyx | Wound class: Dirty or Infected

## 2011-09-22 MED ORDER — PROPOFOL 10 MG/ML IV EMUL
INTRAVENOUS | Status: DC | PRN
  Administered 2011-09-22: 40 mg via INTRAVENOUS
  Administered 2011-09-22: 60 mg via INTRAVENOUS
  Administered 2011-09-22: 300 mg via INTRAVENOUS

## 2011-09-22 MED ORDER — LACTATED RINGERS IV SOLN
INTRAVENOUS | Status: DC
  Administered 2011-09-22 (×2): via INTRAVENOUS

## 2011-09-22 MED ORDER — LIDOCAINE HCL (CARDIAC) 20 MG/ML IV SOLN
INTRAVENOUS | Status: DC | PRN
  Administered 2011-09-22: 60 mg via INTRAVENOUS
  Administered 2011-09-22: 40 mg via INTRAVENOUS

## 2011-09-22 MED ORDER — CEFAZOLIN SODIUM-DEXTROSE 2-3 GM-% IV SOLR
2.0000 g | INTRAVENOUS | Status: AC
  Administered 2011-09-22: 2 g via INTRAVENOUS

## 2011-09-22 MED ORDER — DOXYCYCLINE HYCLATE 100 MG PO TABS: 100.0000 mg | ORAL_TABLET | Freq: Two times a day (BID) | ORAL | Status: AC

## 2011-09-22 MED ORDER — HYDROMORPHONE HCL PF 1 MG/ML IJ SOLN
0.2500 mg | INTRAMUSCULAR | Status: DC | PRN
  Administered 2011-09-22 (×2): 0.5 mg via INTRAVENOUS

## 2011-09-22 MED ORDER — TRAMADOL HCL 50 MG PO TABS: 50.0000 mg | ORAL_TABLET | Freq: Four times a day (QID) | ORAL | Status: AC | PRN

## 2011-09-22 MED ORDER — MIDAZOLAM HCL 5 MG/5ML IJ SOLN
INTRAMUSCULAR | Status: DC | PRN
  Administered 2011-09-22: 2 mg via INTRAVENOUS

## 2011-09-22 MED ORDER — SCOPOLAMINE 1 MG/3DAYS TD PT72
1.0000 | MEDICATED_PATCH | TRANSDERMAL | Status: DC
  Administered 2011-09-22: 1.5 mg via TRANSDERMAL

## 2011-09-22 MED ORDER — SUCCINYLCHOLINE CHLORIDE 20 MG/ML IJ SOLN
INTRAMUSCULAR | Status: DC | PRN
  Administered 2011-09-22: 160 mg via INTRAVENOUS

## 2011-09-22 MED ORDER — GLYCOPYRROLATE 0.2 MG/ML IJ SOLN
INTRAMUSCULAR | Status: DC | PRN
  Administered 2011-09-22: 0.1 mg via INTRAVENOUS

## 2011-09-22 MED ORDER — BUPIVACAINE-EPINEPHRINE 0.25% -1:200000 IJ SOLN
INTRAMUSCULAR | Status: DC | PRN
  Administered 2011-09-22: 20 mL

## 2011-09-22 MED ORDER — FENTANYL CITRATE 0.05 MG/ML IJ SOLN
INTRAMUSCULAR | Status: DC | PRN
  Administered 2011-09-22 (×2): 100 ug via INTRAVENOUS

## 2011-09-22 MED ORDER — MIDAZOLAM HCL 2 MG/2ML IJ SOLN: 0.5000 mg | Freq: Once | INTRAMUSCULAR | Status: DC | PRN

## 2011-09-22 MED ORDER — PROMETHAZINE HCL 25 MG/ML IJ SOLN: 6.2500 mg | INTRAMUSCULAR | Status: DC | PRN

## 2011-09-22 MED ORDER — MEPERIDINE HCL 25 MG/ML IJ SOLN: 6.2500 mg | INTRAMUSCULAR | Status: DC | PRN

## 2011-09-22 MED ORDER — OXYCODONE-ACETAMINOPHEN 5-325 MG PO TABS: 1.0000 | ORAL_TABLET | ORAL | Status: AC | PRN

## 2011-09-22 MED ORDER — ONDANSETRON HCL 4 MG/2ML IJ SOLN
INTRAMUSCULAR | Status: DC | PRN
  Administered 2011-09-22: 4 mg via INTRAVENOUS

## 2011-09-22 MED ORDER — CHLORHEXIDINE GLUCONATE 4 % EX LIQD: 1.0000 "application " | Freq: Once | CUTANEOUS | Status: DC

## 2011-09-22 MED ORDER — DEXAMETHASONE SODIUM PHOSPHATE 4 MG/ML IJ SOLN
INTRAMUSCULAR | Status: DC | PRN
  Administered 2011-09-22: 10 mg via INTRAVENOUS

## 2011-09-22 SURGICAL SUPPLY — 42 items
BLADE HEX COATED 2.75 (ELECTRODE) ×2 IMPLANT
BLADE SURG 10 STRL SS (BLADE) ×2 IMPLANT
BLADE SURG 15 STRL LF DISP TIS (BLADE) ×1 IMPLANT
BLADE SURG 15 STRL SS (BLADE) ×2
CANISTER SUCTION 1500CC (MISCELLANEOUS) ×2 IMPLANT
CHLORAPREP W/TINT 26ML (MISCELLANEOUS) ×2 IMPLANT
CLEANER CAUTERY TIP 5X5 PAD (MISCELLANEOUS) ×1 IMPLANT
CLOTH BEACON ORANGE TIMEOUT ST (SAFETY) ×2 IMPLANT
COVER MAYO STAND STRL (DRAPES) ×2 IMPLANT
COVER TABLE BACK 60X90 (DRAPES) ×2 IMPLANT
DECANTER SPIKE VIAL GLASS SM (MISCELLANEOUS) IMPLANT
DRAIN PENROSE 1/2X12 LTX STRL (WOUND CARE) ×2 IMPLANT
DRAPE LAPAROTOMY T 102X78X121 (DRAPES) ×2 IMPLANT
DRAPE UTILITY XL STRL (DRAPES) ×2 IMPLANT
DRSG PAD ABDOMINAL 8X10 ST (GAUZE/BANDAGES/DRESSINGS) ×2 IMPLANT
ELECT REM PT RETURN 9FT ADLT (ELECTROSURGICAL) ×2
ELECTRODE REM PT RTRN 9FT ADLT (ELECTROSURGICAL) ×1 IMPLANT
GAUZE SPONGE 4X4 12PLY STRL LF (GAUZE/BANDAGES/DRESSINGS) IMPLANT
GLOVE BIO SURGEON STRL SZ 6.5 (GLOVE) ×2 IMPLANT
GLOVE BIO SURGEON STRL SZ8 (GLOVE) ×2 IMPLANT
GLOVE BIO SURGEON STRL SZ8.5 (GLOVE) ×2 IMPLANT
GLOVE BIOGEL PI IND STRL 8.5 (GLOVE) ×1 IMPLANT
GLOVE BIOGEL PI INDICATOR 8.5 (GLOVE) ×1
GOWN PREVENTION PLUS XLARGE (GOWN DISPOSABLE) ×2 IMPLANT
NEEDLE HYPO 25X1 1.5 SAFETY (NEEDLE) ×2 IMPLANT
NS IRRIG 1000ML POUR BTL (IV SOLUTION) ×2 IMPLANT
PACK BASIN DAY SURGERY FS (CUSTOM PROCEDURE TRAY) ×2 IMPLANT
PAD CLEANER CAUTERY TIP 5X5 (MISCELLANEOUS) ×1
PENCIL BUTTON HOLSTER BLD 10FT (ELECTRODE) ×2 IMPLANT
SLEEVE SCD COMPRESS KNEE MED (MISCELLANEOUS) ×2 IMPLANT
SPONGE GAUZE 4X4 12PLY (GAUZE/BANDAGES/DRESSINGS) ×2 IMPLANT
SPONGE LAP 4X18 X RAY DECT (DISPOSABLE) ×2 IMPLANT
STAPLER VISISTAT 35W (STAPLE) IMPLANT
SUT ETHILON 2 0 FSLX (SUTURE) ×2 IMPLANT
SYR CONTROL 10ML LL (SYRINGE) ×2 IMPLANT
TAPE CLOTH 3X10 TAN LF (GAUZE/BANDAGES/DRESSINGS) ×2 IMPLANT
TOWEL OR 17X24 6PK STRL BLUE (TOWEL DISPOSABLE) ×4 IMPLANT
TOWEL OR NON WOVEN STRL DISP B (DISPOSABLE) ×2 IMPLANT
TUBE CONNECTING 20X1/4 (TUBING) ×2 IMPLANT
UNDERPAD 30X30 INCONTINENT (UNDERPADS AND DIAPERS) ×2 IMPLANT
WATER STERILE IRR 1000ML POUR (IV SOLUTION) ×2 IMPLANT
YANKAUER SUCT BULB TIP NO VENT (SUCTIONS) ×2 IMPLANT

## 2011-09-22 NOTE — Anesthesia Procedure Notes (Signed)
Procedure Name: Intubation Date/Time: 09/22/2011 8:45 AM Performed by: Verlan Friends Pre-anesthesia Checklist: Patient identified, Emergency Drugs available, Suction available, Patient being monitored and Timeout performed Patient Re-evaluated:Patient Re-evaluated prior to inductionOxygen Delivery Method: Circle System Utilized Preoxygenation: Pre-oxygenation with 100% oxygen Intubation Type: IV induction Ventilation: Mask ventilation without difficulty Laryngoscope Size: Miller and 3 Grade View: Grade I Tube type: Oral Tube size: 7.0 mm Number of attempts: 1 Airway Equipment and Method: stylet and oral airway Placement Confirmation: ETT inserted through vocal cords under direct vision,  positive ETCO2 and breath sounds checked- equal and bilateral Secured at: 21 cm Tube secured with: Tape Dental Injury: Teeth and Oropharynx as per pre-operative assessment

## 2011-09-22 NOTE — Interval H&P Note (Signed)
History and Physical Interval Note:  09/22/2011 8:21 AM  [name]  has presented today for surgery, with the diagnosis of pilonidal cyst  The various methods of treatment have been discussed with the patient and family. After consideration of risks, benefits and other options for treatment, the patient has consented to  Procedure(s) (LRB): CYST EXCISION PILONIDAL EXTENSIVE (N/A) as a surgical intervention .  The patient's history has been reviewed, patient examined, no change in status, stable for surgery.  I have reviewed the patient's chart and labs.  Questions were answered to the patient's satisfaction.     Bronsyn Shappell A.

## 2011-09-22 NOTE — Anesthesia Preprocedure Evaluation (Signed)
Anesthesia Evaluation  Patient identified by MRN, date of birth, ID band Patient awake    Reviewed: Allergy & Precautions, H&P , NPO status , Patient's Chart, lab work & pertinent test results  History of Anesthesia Complications (+) PONV  Airway Mallampati: I TM Distance: >3 FB Neck ROM: Full    Dental No notable dental hx. (+) Teeth Intact and Dental Advisory Given   Pulmonary neg pulmonary ROS,  breath sounds clear to auscultation  Pulmonary exam normal       Cardiovascular negative cardio ROS  Rhythm:Regular Rate:Normal     Neuro/Psych negative neurological ROS     GI/Hepatic negative GI ROS, Neg liver ROS,   Endo/Other  negative endocrine ROS  Renal/GU negative Renal ROS     Musculoskeletal   Abdominal (+) + obese,   Peds  Hematology negative hematology ROS (+)   Anesthesia Other Findings   Reproductive/Obstetrics                           Anesthesia Physical Anesthesia Plan  ASA: I  Anesthesia Plan: General   Post-op Pain Management:    Induction: Intravenous  Airway Management Planned: Oral ETT  Additional Equipment:   Intra-op Plan:   Post-operative Plan: Extubation in OR  Informed Consent: I have reviewed the patients History and Physical, chart, labs and discussed the procedure including the risks, benefits and alternatives for the proposed anesthesia with the patient or authorized representative who has indicated his/her understanding and acceptance.   Dental advisory given  Plan Discussed with: CRNA and Surgeon  Anesthesia Plan Comments: (Plan routine monitors, GETA )        Anesthesia Quick Evaluation

## 2011-09-22 NOTE — Anesthesia Postprocedure Evaluation (Signed)
  Anesthesia Post-op Note  Patient: [name]  Procedure(s) Performed: Procedure(s) (LRB): CYST EXCISION PILONIDAL EXTENSIVE (N/A)  Patient Location: PACU  Anesthesia Type: General  Level of Consciousness: awake, alert  and oriented  Airway and Oxygen Therapy: Patient Spontanous Breathing  Post-op Pain: none  Post-op Assessment: Post-op Vital signs reviewed, Patient's Cardiovascular Status Stable, Respiratory Function Stable, Patent Airway, No signs of Nausea or vomiting and Pain level controlled  Post-op Vital Signs: Reviewed and stable  Complications: No apparent anesthesia complications

## 2011-09-22 NOTE — Op Note (Signed)
Preoperative diagnosis: 4 x 5 cm pilonidal open wound chronic  Postop diagnosis: Same plus perianal hidradenitis suppurativa chronic  Procedure: Debridement of pilonidal wound and closure over half inch Penrose drain  Surgeon: Harriette Bouillon M.D.  Anesthesia: Gen. Endotracheal anesthesia with 0.25% Sensorcaine local with epinephrine  Specimen: Cavity and debridement to pathology  Drains: One half inch Penrose  IV fluids: Thousand cc crystalloid  EBL: Less than 30 cc  Indications for procedure: The patient is a 26 year old female with a chronic pilonidal wound. It has been open and draining in the past has not healed over the last 6 months. I recommended debridement of this in the office to help this heal since she had chronic granulation tissue in the wound measures 4 x 5 cm. There is no erythema but there is some fibrinous exudate from it. Risk of bleeding, infection, injury to the sphincter muscles, poor wound healing, the need for the surgery, injury to nerves, arteries, veins, and the need for more extensive procedures discussed. She to proceed.  Description of procedure: Patient was met in the holding area and questions are answered. She's taken back to the operating room and placed supine where general anesthesia was initiated. She's and placed prone and appropriate padded. The gluteal cleft was examined and an open 4 x 5 cm was present but she also had areas of hidradenitis surrounding the anal canal which was new. No active infection was noted that. This area was prepped and draped in a sterile fashion. She received 2 g of Ancef. Timeout was done. I began to debride the wound. The wound extended down into the external sphincter mechanism. Care is taken to preserve as much of this but there was some erosion of the external sphincter mechanism. She has not had problems with incontinence. This did not track into the rectum. Digital rectal examination was performed and there was no indication  from the wound to the rectum. The wound was copiously irrigated. An attempt to get this to heal, I elected placed a 1/2 inch Penrose drain with 2 separate stab incisions through the wound and secured to the skin with 2-0 nylon. I then used 2-0 nylon to loosely approximate the edges of the wound to facilitate closure. Dry dressings were applied and local anesthesia was infiltrated around the wound. All final counts are found to be correct. The patient was then placed supine extubated taken to recovery in satisfactory condition.

## 2011-09-22 NOTE — Transfer of Care (Signed)
Immediate Anesthesia Transfer of Care Note  Patient: [name]  Procedure(s) Performed: Procedure(s) (LRB): CYST EXCISION PILONIDAL EXTENSIVE (N/A)  Patient Location: PACU  Anesthesia Type: General  Level of Consciousness: awake, alert , oriented and patient cooperative  Airway & Oxygen Therapy: Patient Spontanous Breathing and Patient connected to face mask oxygen  Post-op Assessment: Report given to PACU RN and Post -op Vital signs reviewed and stable  Post vital signs: Reviewed and stable  Complications: No apparent anesthesia complications

## 2011-09-22 NOTE — H&P (Signed)
[name]   MRN: [id number]   Description: 26 year old female  Provider: Dortha Schwalbe., MD  Department: [facility]        Diagnoses     Pilonidal cyst   - Primary    685         Vitals - Last Recorded       BP Pulse Temp Ht Wt BMI    120/82 80 97.8 F (36.6 C) 5\' 10"  (1.778 m) 200 lb (90.719 kg) 28.70 kg/m2       Vitals History Recorded       Progress Notes     Patient ID: [name], female   DOB: 1985/10/07, 26 y.o.   MRN: [id number]   No chief complaint on file.   HPI [name] is a 26 y.o. female.   HPIThe patient presents with the complaint of cyst of her tailbone. For three  month she had an area over her tailbone and opened and drained. She was placed on antibiotics. She is applying saline to the wound over her tailbone. She has no more pain today. And the drainage stopped. She denies any fever or chills. She is sent at the request of Dr. Lovell Sheehan. She was seen two months ago.  No pain but continues to have drainage.    Past Medical History   Diagnosis  Date   .  LGSIL (low grade squamous intraepithelial dysplasia)  2006       c&b   .  History of colposcopy with cervical biopsy  2009       lgsil   .  High risk HPV infection  2009   .  Rubella immune  2010   .  Anxiety         Past Surgical History   Procedure  Date   .  Shoulder surgery  2006       right       Family History   Problem  Relation  Age of Onset   .  Cancer  Mother         breast   .  Hypertension  Father     .  Diabetes  Maternal Grandmother     .  Osteoporosis  Maternal Grandmother     .  Heart disease  Maternal Grandfather     .  Diabetes  Paternal Grandmother     .  Rectal cancer  Paternal Grandmother     .  Cancer  Paternal Grandmother         rectal   .  Heart disease  Paternal Grandfather     .  Lung cancer  Paternal Grandfather     .  Cancer  Paternal Grandfather         lung      Social History History   Substance Use Topics   .  Smoking  status:  Never Smoker    .  Smokeless tobacco:  Not on file   .  Alcohol Use:  No      No Known Allergies    Current Outpatient Prescriptions   Medication  Sig  Dispense  Refill   .  Cetirizine-Pseudoephedrine (ZYRTEC-D PO)  Take by mouth as needed.           .  Norgestimate-Ethinyl Estradiol Triphasic (ORTHO TRI-CYCLEN LO) 0.18/0.215/0.25 MG-25 MCG tablet  Take 1 tablet by mouth daily.   1 Package   11   .  sertraline (ZOLOFT) 50  MG tablet  Take 50 mg by mouth daily.              Review of Systems Review of Systems  Constitutional: Negative for fever, chills and unexpected weight change.  HENT: Negative for hearing loss, congestion, sore throat, trouble swallowing and voice change.   Eyes: Negative for visual disturbance.  Respiratory: Negative for cough and wheezing.   Cardiovascular: Negative for chest pain, palpitations and leg swelling.  Gastrointestinal: Negative for nausea, vomiting, abdominal pain, diarrhea, constipation, blood in stool, abdominal distention and anal bleeding.  Genitourinary: Negative for hematuria, vaginal bleeding and difficulty urinating.  Musculoskeletal: Negative for arthralgias.  Skin: Negative for rash and wound.  Neurological: Negative for seizures, syncope and headaches.  Hematological: Negative for adenopathy. Does not bruise/bleed easily.  Psychiatric/Behavioral: Negative for confusion.      Blood pressure 120/82, pulse 80, temperature 97.8 F (36.6 C), height 5\' 10"  (1.778 m), weight 200 lb (90.719 kg).   Physical Exam Physical Exam  Constitutional: She is oriented to person, place, and time. She appears well-developed and well-nourished.  HENT:   Head: Normocephalic and atraumatic.  Eyes: EOM are normal. Pupils are equal, round, and reactive to light.  Neck: Normal range of motion. Neck supple.  Musculoskeletal: Normal range of motion.  Neurological: She is alert and oriented to person, place, and time.  Skin:  Multiple sinus  tracts on two open wounds in gluteal cleft.  Mild drainage no cellulitis.  Psychiatric: She has a normal mood and affect. Her behavior is normal. Judgment and thought content normal.      Data Reviewed     Assessment Pilonidal disease   Plan Excision pilonidal cyst.   Risks include bleeding, infection, poor wound healing, organ injury, the need for more surgery, and recurrence. She agrees to proceed.       Hillery Bhalla A. 09/22/2011

## 2011-09-23 ENCOUNTER — Encounter (HOSPITAL_BASED_OUTPATIENT_CLINIC_OR_DEPARTMENT_OTHER): Payer: Self-pay | Admitting: Surgery

## 2011-09-27 ENCOUNTER — Telehealth (INDEPENDENT_AMBULATORY_CARE_PROVIDER_SITE_OTHER): Payer: Self-pay | Admitting: General Surgery

## 2011-09-27 NOTE — Telephone Encounter (Signed)
Pt calling to report that her tongue is still numb.  She reported this DOS to recovery nurse; anesthesia stated it was residual to Lidocaine spray used in mouth to begin induction.  Her tongue has remained numb since that time, now POD  #5.  Advised pt to call [facility] and speak with nurse there.  She understands and will comply.

## 2011-10-04 ENCOUNTER — Encounter (INDEPENDENT_AMBULATORY_CARE_PROVIDER_SITE_OTHER): Payer: BC Managed Care – PPO | Admitting: General Surgery

## 2011-10-04 ENCOUNTER — Encounter (INDEPENDENT_AMBULATORY_CARE_PROVIDER_SITE_OTHER): Payer: Self-pay | Admitting: General Surgery

## 2011-10-04 ENCOUNTER — Telehealth (INDEPENDENT_AMBULATORY_CARE_PROVIDER_SITE_OTHER): Payer: Self-pay | Admitting: General Surgery

## 2011-10-04 ENCOUNTER — Ambulatory Visit (INDEPENDENT_AMBULATORY_CARE_PROVIDER_SITE_OTHER): Payer: BC Managed Care – PPO | Admitting: General Surgery

## 2011-10-04 DIAGNOSIS — Z09 Encounter for follow-up examination after completed treatment for conditions other than malignant neoplasm: Secondary | ICD-10-CM

## 2011-10-04 NOTE — Telephone Encounter (Signed)
I CONTACTED MS TUCKER PER DIRECTIONS FROM DR. GERKIN AFTER SPEAKING WITH HER OVER THE WEEKEND/ HER DRAIN APPEARS TO BE MOVING OUT OF POSITION AND INCISION IS SEPARATING. I SCHEDULED HER TO SEE DR. BYERLY IN URGENT OFFICE TODAY PER INSTRUCTIONS/ PT AWARE/GY

## 2011-10-04 NOTE — Progress Notes (Signed)
Subjective:     Patient ID: [name], female   DOB: 1985-09-21, 26 y.o.   MRN: [id number]  HPI 82 yof s/p excision of pilonidal disease by Dr. Luisa Hart a couple weeks ago.  She had penrose drain left for the entire length.  Over past weekend it opened up a little and is causing pain at site of drain.    Review of Systems     Objective:   Physical Exam There is penrose that enters to left at superior aspect of incision and it exits via the lower portion of her incision The top several centimeters have separated but there is no infection    Assessment:     Pilonidal cyst excision     Plan:     There is no infection.  I removed a stitch that pulled through that was bothering her.  I told them this could not be closed and would just have to heal.  She will see Dr. Luisa Hart later this week.

## 2011-10-07 ENCOUNTER — Ambulatory Visit (INDEPENDENT_AMBULATORY_CARE_PROVIDER_SITE_OTHER): Payer: BC Managed Care – PPO | Admitting: Surgery

## 2011-10-07 ENCOUNTER — Encounter (INDEPENDENT_AMBULATORY_CARE_PROVIDER_SITE_OTHER): Payer: Self-pay | Admitting: Surgery

## 2011-10-07 VITALS — BP 126/84 | HR 106 | Temp 97.8°F | Resp 14 | Ht 69.0 in | Wt 197.0 lb

## 2011-10-07 DIAGNOSIS — Z9889 Other specified postprocedural states: Secondary | ICD-10-CM

## 2011-10-07 NOTE — Patient Instructions (Signed)
Return next week for drain removal 

## 2011-10-07 NOTE — Progress Notes (Signed)
Patient returns after excision of complex pilonidal cyst. She was seen ears office this week for stitch removal rather drain. She had some bleeding irritation around drain site. She has a lot of itching but not much pain.  Exam: There is separation and some serous sanguinous drainage from the wound. Drainage tube intact. Minimal skin irritation.  Impression: Status post excision of chronic infected Tylenol cyst  Plan: Return 1 week for drain removal and stitch removal. Continue present care.

## 2011-10-11 NOTE — Progress Notes (Signed)
This encounter was created in error - please disregard.

## 2011-10-15 ENCOUNTER — Encounter (INDEPENDENT_AMBULATORY_CARE_PROVIDER_SITE_OTHER): Payer: Self-pay | Admitting: Surgery

## 2011-10-15 ENCOUNTER — Ambulatory Visit (INDEPENDENT_AMBULATORY_CARE_PROVIDER_SITE_OTHER): Payer: BC Managed Care – PPO | Admitting: Surgery

## 2011-10-15 VITALS — BP 120/78 | HR 106 | Temp 98.2°F | Ht 70.0 in | Wt 202.6 lb

## 2011-10-15 DIAGNOSIS — Z9889 Other specified postprocedural states: Secondary | ICD-10-CM

## 2011-10-15 NOTE — Patient Instructions (Signed)
Continue current care.

## 2011-10-15 NOTE — Progress Notes (Signed)
Pt return in follow up of pilonidal wound.  Drains and sutures removed. There is separation of 2 cm   X 5 cm but wound clean.   No sign  of infection.  Return 2 weeks.

## 2011-11-02 ENCOUNTER — Encounter (INDEPENDENT_AMBULATORY_CARE_PROVIDER_SITE_OTHER): Payer: BC Managed Care – PPO | Admitting: Surgery

## 2011-11-09 ENCOUNTER — Encounter (INDEPENDENT_AMBULATORY_CARE_PROVIDER_SITE_OTHER): Payer: BC Managed Care – PPO | Admitting: Surgery

## 2011-11-18 ENCOUNTER — Encounter (INDEPENDENT_AMBULATORY_CARE_PROVIDER_SITE_OTHER): Payer: Self-pay | Admitting: Surgery

## 2011-11-18 ENCOUNTER — Ambulatory Visit (INDEPENDENT_AMBULATORY_CARE_PROVIDER_SITE_OTHER): Payer: BC Managed Care – PPO | Admitting: Surgery

## 2011-11-18 VITALS — BP 130/82 | HR 72 | Temp 97.3°F | Resp 12 | Ht 70.0 in | Wt 201.2 lb

## 2011-11-18 DIAGNOSIS — Z9889 Other specified postprocedural states: Secondary | ICD-10-CM

## 2011-11-18 NOTE — Progress Notes (Signed)
Patient returns after excision of a large infected pilonidal cyst.  She feels well.  Exam: Mood is approximately 5 x 4 x 2 cm. It is clean. There is some to resection date. No signs of invasive infection.  Impression: Status post excision of infected pilonidal cyst  Plan: Return in one month. Continue application of Neosporin and dry dressing.

## 2011-11-18 NOTE — Patient Instructions (Signed)
Apply triple antibiotic ointment to area.  Return 1 month

## 2011-11-23 ENCOUNTER — Other Ambulatory Visit: Payer: Self-pay | Admitting: *Deleted

## 2011-11-23 MED ORDER — NORGESTIM-ETH ESTRAD TRIPHASIC 0.18/0.215/0.25 MG-25 MCG PO TABS: 1.0000 | ORAL_TABLET | Freq: Every day | ORAL | Status: DC

## 2011-12-14 ENCOUNTER — Ambulatory Visit (INDEPENDENT_AMBULATORY_CARE_PROVIDER_SITE_OTHER): Payer: BC Managed Care – PPO | Admitting: Surgery

## 2011-12-14 ENCOUNTER — Encounter (INDEPENDENT_AMBULATORY_CARE_PROVIDER_SITE_OTHER): Payer: Self-pay | Admitting: Surgery

## 2011-12-14 VITALS — BP 118/72 | HR 86 | Temp 98.0°F | Resp 18 | Ht 69.0 in | Wt 201.2 lb

## 2011-12-14 DIAGNOSIS — Z9889 Other specified postprocedural states: Secondary | ICD-10-CM

## 2011-12-14 NOTE — Patient Instructions (Signed)
Apply neosporin twice a day.  Return 3 weeks.

## 2011-12-14 NOTE — Progress Notes (Signed)
Patient returns after excision of a large infected pilonidal cyst.  She feels well.  Exam: PILONIDAL wound is approximately 5 x 4 x 2 cm. It is clean. There is some exudate but No signs of invasive infection.  Impression: Status post excision of infected pilonidal cyst complex with complex wound.  Plan: Return in one month. Change  application of Neosporin  To BID and dry dressing.

## 2012-01-11 ENCOUNTER — Ambulatory Visit (INDEPENDENT_AMBULATORY_CARE_PROVIDER_SITE_OTHER): Payer: BC Managed Care – PPO | Admitting: Surgery

## 2012-01-11 ENCOUNTER — Encounter (INDEPENDENT_AMBULATORY_CARE_PROVIDER_SITE_OTHER): Payer: Self-pay | Admitting: Surgery

## 2012-01-11 VITALS — BP 112/68 | HR 72 | Temp 97.2°F | Resp 16 | Ht 70.0 in | Wt 204.0 lb

## 2012-01-11 DIAGNOSIS — L988 Other specified disorders of the skin and subcutaneous tissue: Secondary | ICD-10-CM

## 2012-01-11 NOTE — Patient Instructions (Signed)
Continue neosporin

## 2012-01-11 NOTE — Progress Notes (Signed)
Patient returns after excision of a large infected pilonidal cyst.  She feels well. Less drainage.  Exam: PILONIDAL wound is approximately 5 x 3 x 1 cm. It is clean. There is some exudate but No signs of invasive infection.  Impression: Status post excision of infected pilonidal cyst complex with complex wound. There is reduction in wound size.  Plan: Return in one month. Change  application of Neosporin  To BID and dry dressing.

## 2012-02-07 ENCOUNTER — Ambulatory Visit (INDEPENDENT_AMBULATORY_CARE_PROVIDER_SITE_OTHER): Payer: BC Managed Care – PPO | Admitting: Surgery

## 2012-02-07 ENCOUNTER — Encounter (INDEPENDENT_AMBULATORY_CARE_PROVIDER_SITE_OTHER): Payer: Self-pay | Admitting: Surgery

## 2012-02-07 VITALS — BP 124/72 | HR 80 | Temp 97.2°F | Resp 12 | Ht 70.0 in | Wt 201.4 lb

## 2012-02-07 DIAGNOSIS — L988 Other specified disorders of the skin and subcutaneous tissue: Secondary | ICD-10-CM

## 2012-02-07 NOTE — Progress Notes (Signed)
Patient returns after excision of a large infected pilonidal cyst.  She feels well. Less drainage. Fell off chair decorating tree  Exam: PILONIDAL wound is approximately 5 x 2 x 1 cm. It is clean. There is some exudate but No signs of invasive infection.  Impression: Status post excision of infected pilonidal cyst complex with complex wound. There is reduction in wound size by 1 cm compared to last month.  Plan: Return in one month. Continue   application of Neosporin  To BID and dry dressing. Slowly improving.  No need for debridement at this point but progress is slow.

## 2012-02-07 NOTE — Patient Instructions (Signed)
Return 1 month

## 2012-03-06 ENCOUNTER — Ambulatory Visit (INDEPENDENT_AMBULATORY_CARE_PROVIDER_SITE_OTHER): Payer: BC Managed Care – PPO | Admitting: Surgery

## 2012-03-06 ENCOUNTER — Encounter (INDEPENDENT_AMBULATORY_CARE_PROVIDER_SITE_OTHER): Payer: Self-pay | Admitting: Surgery

## 2012-03-06 VITALS — BP 110/78 | HR 88 | Temp 97.3°F | Resp 16 | Ht 70.0 in | Wt 204.2 lb

## 2012-03-06 DIAGNOSIS — L988 Other specified disorders of the skin and subcutaneous tissue: Secondary | ICD-10-CM

## 2012-03-06 NOTE — Patient Instructions (Signed)
Return 1 month

## 2012-03-06 NOTE — Progress Notes (Signed)
Patient returns after excision of a large infected pilonidal cyst.  She feels well. Less drainage. Some pain with BM.    Exam: PILONIDAL wound is approximately 4.5 x 2 x 1 cm. It is clean. There is some exudate but No signs of invasive infection.  Impression: Status post excision of infected pilonidal cyst complex with complex wound. There is reduction in wound size by 1 cm compared to last month.  Plan: Return in one month. Continue   application of Neosporin  To BID and dry dressing. Slowly improving.  No need for debridement at this point but progress is slow.  Add more fiber to diet

## 2012-03-09 ENCOUNTER — Telehealth: Payer: Self-pay | Admitting: Cardiovascular Disease

## 2012-03-09 NOTE — Telephone Encounter (Signed)
Records Received From [facility], Gave to [place] For Pt NP appt 03/24/12   03/09/12/KM

## 2012-03-24 ENCOUNTER — Encounter: Payer: Self-pay | Admitting: Cardiovascular Disease

## 2012-03-24 ENCOUNTER — Ambulatory Visit (INDEPENDENT_AMBULATORY_CARE_PROVIDER_SITE_OTHER): Payer: BC Managed Care – PPO | Admitting: Cardiovascular Disease

## 2012-03-24 VITALS — BP 144/91 | HR 69 | Ht 70.0 in | Wt 205.0 lb

## 2012-03-24 DIAGNOSIS — Z136 Encounter for screening for cardiovascular disorders: Secondary | ICD-10-CM

## 2012-03-24 NOTE — Patient Instructions (Addendum)
Your physician recommends that you schedule a follow-up appointment as needed with Dr. McAlhany  Your physician has requested that you have an echocardiogram. Echocardiography is a painless test that uses sound waves to create images of your heart. It provides your doctor with information about the size and shape of your heart and how well your heart's chambers and valves are working. This procedure takes approximately one hour. There are no restrictions for this procedure.    

## 2012-03-24 NOTE — Progress Notes (Signed)
History of Present Illness: 27 yo female with history of anxiety who is here today for cardiac evaluation. Her father is our patient and is known to have a bicuspid aortic valve with dilated thoracic aorta and recently had aortic valve replacement/thoracic aortic replacement. She has been worried that she could also have a congenital cardiac abnormality and is here today for cardiac screening.    She is feeling well. She has no dyspnea, chest pain, dizziness, near syncope or syncope. She does not drink alcohol, smoke cigarettes or use illicit drugs. 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 She is very active and exercises frequently.   Primary Care Provider: Maryelizabeth Rowan, NP (OB/Gyn)  Past Medical History  Diagnosis Date  . Anxiety   . PONV (postoperative nausea and vomiting)   . Seasonal allergies   . Pilonidal cyst 08/2011    is open and draining  . 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 Shoulder surgery 2006    right  . Wisdom tooth extraction   . Pilonidal cyst excision 09/22/2011    Procedure: CYST EXCISION PILONIDAL EXTENSIVE;  Surgeon: Clovis Pu. Cornett, MD;  Location: [facility];  Service: General;  Laterality: N/A;    Current Outpatient Prescriptions  Medication Sig Dispense Refill  . Cetirizine-Pseudoephedrine (ZYRTEC-D PO) Take 1 tablet by mouth as needed.       . norgestimate-ethinyl estradiol (ORTHO-CYCLEN,SPRINTEC,PREVIFEM) 0.25-35 MG-MCG tablet Take 1 tablet by mouth daily.  1 Package  12  . Norgestimate-Ethinyl Estradiol Triphasic (ORTHO TRI-CYCLEN LO) 0.18/0.215/0.25 MG-25 MCG tab Take 1 tablet by mouth daily.  1 Package  9  . sertraline (ZOLOFT) 50 MG tablet         No Known Allergies  History   Social History  . Marital Status: Single    Spouse Name: Engaged    Number of Children: 0  . Years of Education: N/A   Occupational History  . Special  Ed Teacher [employer]   Social History Main Topics  . Smoking status: Never Smoker   . Smokeless tobacco: Never Used  . Alcohol Use: No  . Drug Use: No  . Sexually Active: Yes   Other Topics Concern  . Not on file   Social History Narrative  . No narrative on file    Family History  Problem Relation Age of Onset  . Cancer Mother     breast  . Anesthesia problems Mother     post-op N/V  . Hypertension Father   . Diabetes Maternal Grandmother   . Osteoporosis Maternal Grandmother   . Heart disease Maternal Grandfather   . Diabetes Paternal Grandmother   . Rectal cancer Paternal Grandmother   . Cancer Paternal Grandmother     rectal  . Heart disease Paternal Grandfather   . Lung cancer Paternal Grandfather   . Cancer Paternal Grandfather     lung  . Heart failure Paternal Grandfather   . Congenital heart disease Father   . Heart attack Maternal Grandfather   . Heart attack Paternal Grandfather     Review of Systems:  As stated in the HPI and otherwise negative.   BP 144/91  Pulse 69  Ht 5\' 10"  (1.778 m)  Wt 205 lb (92.987 kg)  BMI 29.41 kg/m2  Physical Examination: General: Well developed, well nourished, NAD HEENT: OP clear, mucus membranes moist SKIN: warm, dry. No  rashes. Neuro: No focal deficits Musculoskeletal: Muscle strength 5/5 all ext Psychiatric: Mood and affect normal Neck: No JVD, no carotid bruits, no thyromegaly, no lymphadenopathy. Lungs:Clear bilaterally, no wheezes, rhonci, crackles Cardiovascular: Regular rate and rhythm. No murmurs, gallops or rubs. Abdomen:Soft. Bowel sounds present. Non-tender.  Extremities: No lower extremity edema. Pulses are 2 + in the bilateral DP/PT.  EKG: NSR, rate 95 bpm. Non-specific ST abnormality. Overall normal.   Assessment and Plan:   1. Screening cardiovascular examination: She has a family history of congenital heart disease. Her father is known to have a bicuspid aortic valve with a  thoracic aortic aneurysm. He recently had his aortic valve and aortic root replaced. She is asymptomatic. Will arrange echocardiogram for screening for congenital heart disease. Follow up prn.

## 2012-03-30 ENCOUNTER — Other Ambulatory Visit: Payer: Self-pay

## 2012-03-30 ENCOUNTER — Telehealth: Payer: Self-pay | Admitting: *Deleted

## 2012-03-30 ENCOUNTER — Ambulatory Visit (HOSPITAL_COMMUNITY): Payer: BC Managed Care – PPO | Attending: Cardiology | Admitting: Radiology

## 2012-03-30 DIAGNOSIS — Z136 Encounter for screening for cardiovascular disorders: Secondary | ICD-10-CM | POA: Insufficient documentation

## 2012-03-30 DIAGNOSIS — Z8249 Family history of ischemic heart disease and other diseases of the circulatory system: Secondary | ICD-10-CM | POA: Insufficient documentation

## 2012-03-30 NOTE — Telephone Encounter (Signed)
Information below copied from message from Dr. Clifton James. I spoke with pt and gave her this information    [name], [name]     Kathleene Hazel, MD     Sent: Thu March 30, 2012  5:36 PM     Message     Can we let her know echo is ok. Thanks, chris

## 2012-03-30 NOTE — Progress Notes (Signed)
Echocardiogram performed.  

## 2012-04-10 ENCOUNTER — Encounter (INDEPENDENT_AMBULATORY_CARE_PROVIDER_SITE_OTHER): Payer: Self-pay | Admitting: Surgery

## 2012-04-10 ENCOUNTER — Ambulatory Visit (INDEPENDENT_AMBULATORY_CARE_PROVIDER_SITE_OTHER): Payer: BC Managed Care – PPO | Admitting: Surgery

## 2012-04-10 VITALS — BP 122/82 | HR 72 | Resp 12 | Ht 70.0 in | Wt 204.8 lb

## 2012-04-10 DIAGNOSIS — Z872 Personal history of diseases of the skin and subcutaneous tissue: Secondary | ICD-10-CM

## 2012-04-10 NOTE — Patient Instructions (Signed)
Return 6 weeks

## 2012-04-10 NOTE — Progress Notes (Signed)
Patient returns after excision of a large infected pilonidal cyst.  She feels well. Less drainage. Some pain with BM.    Exam: PILONIDAL wound is approximately 3.5 x 2 x 1 cm. It is clean. There is some exudate but No signs of invasive infection.  Impression: Status post excision of infected pilonidal cyst complex with complex wound. There is reduction in wound size by 0.5 cm compared to last month.  Plan: Return in one month. Continue   application of Neosporin  To BID and dry dressing. Slowly improving.  No need for debridement at this point but progress is slow.  Add more fiber to diet         OVERALL 75 %  HEALED.

## 2012-05-04 ENCOUNTER — Ambulatory Visit: Payer: BC Managed Care – PPO | Admitting: Gynecology

## 2012-05-08 ENCOUNTER — Other Ambulatory Visit: Payer: Self-pay | Admitting: Women's Health

## 2012-05-08 ENCOUNTER — Other Ambulatory Visit: Payer: Self-pay | Admitting: Gynecology

## 2012-05-16 ENCOUNTER — Ambulatory Visit (INDEPENDENT_AMBULATORY_CARE_PROVIDER_SITE_OTHER): Payer: BC Managed Care – PPO | Admitting: Surgery

## 2012-05-16 ENCOUNTER — Encounter (INDEPENDENT_AMBULATORY_CARE_PROVIDER_SITE_OTHER): Payer: Self-pay | Admitting: Surgery

## 2012-05-16 VITALS — BP 126/82 | HR 81 | Temp 96.8°F | Resp 16 | Ht 70.0 in | Wt 208.4 lb

## 2012-05-16 DIAGNOSIS — L988 Other specified disorders of the skin and subcutaneous tissue: Secondary | ICD-10-CM

## 2012-05-16 NOTE — Progress Notes (Signed)
Patient returns after excision of a large infected pilonidal cyst.  She feels well. Less drainage.     Exam: PILONIDAL wound is approximately 3 x 1 x 0.5  cm. It is clean. There is some exudate but No signs of invasive infection.  Impression: Status post excision of infected pilonidal cyst complex with complex wound. There is reduction in wound size by 0.5 cm compared to last month.  Plan: Return in one month. Continue   application of Neosporin  To BID and dry dressing. Slowly improving.  No need for debridement at this point but progress is slow.  Add more fiber to diet         OVERALL 80  %  HEALED.

## 2012-05-16 NOTE — Patient Instructions (Signed)
Return 1 month

## 2012-07-03 ENCOUNTER — Encounter (INDEPENDENT_AMBULATORY_CARE_PROVIDER_SITE_OTHER): Payer: BC Managed Care – PPO | Admitting: Surgery

## 2012-07-19 ENCOUNTER — Encounter (INDEPENDENT_AMBULATORY_CARE_PROVIDER_SITE_OTHER): Payer: BC Managed Care – PPO | Admitting: Surgery

## 2012-08-04 ENCOUNTER — Ambulatory Visit (INDEPENDENT_AMBULATORY_CARE_PROVIDER_SITE_OTHER): Payer: BC Managed Care – PPO | Admitting: Surgery

## 2012-08-04 ENCOUNTER — Encounter (INDEPENDENT_AMBULATORY_CARE_PROVIDER_SITE_OTHER): Payer: Self-pay | Admitting: Surgery

## 2012-08-04 VITALS — BP 122/78 | HR 109 | Temp 97.9°F | Resp 16 | Ht 70.0 in | Wt 207.2 lb

## 2012-08-04 DIAGNOSIS — L988 Other specified disorders of the skin and subcutaneous tissue: Secondary | ICD-10-CM

## 2012-08-04 NOTE — Progress Notes (Signed)
Patient returns after excision of a large infected pilonidal cyst 08/2011.  She feels well. Its causing no problems.  Very slow healing but pt is asymptomatic.[name]   Getting married this summer.  Exam: PILONIDAL wound is approximately 2 x 1 x 0.5  cm. It is clean. There is some exudate but No signs of invasive infection. Silver nitrate applies for excessive granulation tissue.   Impression: Status post excision of infected pilonidal cyst complex with complex wound with very slow healing bur 1 cm smaller compared to 10 weeks ago.  There is reduction in wound size by 1 cm compared to last month.  Plan: Return in one month. Continue   application of Neosporin  To BID and dry dressing. Slowly improving.  No need for debridement at this point but progress is slow.  Add more fiber to diet         OVERALL 85 -90 %   %  HEALED. Instruction for wound care give which is to expect some bleeding and wear pad.  Wound will have dark appearance for a few days.

## 2012-08-04 NOTE — Patient Instructions (Signed)
Expect some bleeding from the wound.  May need to wear extra pad as needed.  85 % healed at this point.  Return 1 month.

## 2012-09-04 ENCOUNTER — Encounter (INDEPENDENT_AMBULATORY_CARE_PROVIDER_SITE_OTHER): Payer: Self-pay | Admitting: Surgery

## 2012-09-04 ENCOUNTER — Ambulatory Visit (INDEPENDENT_AMBULATORY_CARE_PROVIDER_SITE_OTHER): Payer: BC Managed Care – PPO | Admitting: Surgery

## 2012-09-04 VITALS — BP 120/72 | HR 98 | Resp 14 | Ht 70.0 in | Wt 207.0 lb

## 2012-09-04 DIAGNOSIS — L988 Other specified disorders of the skin and subcutaneous tissue: Secondary | ICD-10-CM

## 2012-09-04 NOTE — Patient Instructions (Signed)
Return 1 month for application of A cell to wound.

## 2012-09-04 NOTE — Progress Notes (Signed)
Patient returns after excision of a large infected pilonidal cyst 08/2011.  She feels well. Its causing no problems.  Very slow healing but pt is asymptomatic.[name]   Getting married this summer.  Exam: PILONIDAL wound is approximately 2 x 1 x 0.5  cm. It is clean. There is some exudate but No signs of invasive infection. Silver nitrate applies for excessive granulation tissue.   Impression: Status post excision of infected pilonidal cyst complex with complex wound with very slow healing bur 1 cm smaller compared to 10 weeks ago.  There is reduction in wound size by 1 cm compared to last month.  Plan: Return in one month. Continue   application of Neosporin  To BID and dry dressing. Slowly improving.  .  Add more fiber to diet         OVERALL 85 -90 %   Change in the size and appearance of the wound. I discussed further debridement in the operating room with the addition of adding growth factors versus doing this along the office since the area is not healed up. The patient went to try application of growth factors in the office first prior to any further surgical debridement. Return to clinic one month. The wound is no longer healing but is clean and noninfected.

## 2012-09-13 ENCOUNTER — Other Ambulatory Visit: Payer: Self-pay

## 2012-09-13 ENCOUNTER — Encounter: Payer: Self-pay | Admitting: Women's Health

## 2012-09-13 ENCOUNTER — Other Ambulatory Visit (HOSPITAL_COMMUNITY)
Admission: RE | Admit: 2012-09-13 | Discharge: 2012-09-13 | Disposition: A | Discharge status: Home / Self Care | Payer: BC Managed Care – PPO | Source: Ambulatory Visit | Attending: Gynecology | Admitting: Gynecology

## 2012-09-13 ENCOUNTER — Ambulatory Visit (INDEPENDENT_AMBULATORY_CARE_PROVIDER_SITE_OTHER): Payer: BC Managed Care – PPO | Admitting: Women's Health

## 2012-09-13 VITALS — BP 114/70 | Ht 70.0 in | Wt 210.0 lb

## 2012-09-13 DIAGNOSIS — Z01419 Encounter for gynecological examination (general) (routine) without abnormal findings: Secondary | ICD-10-CM | POA: Insufficient documentation

## 2012-09-13 DIAGNOSIS — Z853 Personal history of malignant neoplasm of breast: Secondary | ICD-10-CM

## 2012-09-13 DIAGNOSIS — IMO0001 Reserved for inherently not codable concepts without codable children: Secondary | ICD-10-CM

## 2012-09-13 DIAGNOSIS — Z309 Encounter for contraceptive management, unspecified: Secondary | ICD-10-CM

## 2012-09-13 DIAGNOSIS — Z1231 Encounter for screening mammogram for malignant neoplasm of breast: Secondary | ICD-10-CM

## 2012-09-13 DIAGNOSIS — B373 Candidiasis of vulva and vagina: Secondary | ICD-10-CM

## 2012-09-13 LAB — CBC WITH DIFFERENTIAL/PLATELET
Eosinophils Relative: 5 % (ref 0–5)
HCT: 38.8 % (ref 36.0–46.0)
Hemoglobin: 13.3 g/dL (ref 12.0–15.0)
Lymphocytes Relative: 36 % (ref 12–46)
Lymphs Abs: 3.7 10*3/uL (ref 0.7–4.0)
MCV: 85.5 fL (ref 78.0–100.0)
Monocytes Absolute: 0.8 10*3/uL (ref 0.1–1.0)
Monocytes Relative: 8 % (ref 3–12)
Neutro Abs: 5.2 10*3/uL (ref 1.7–7.7)
RBC: 4.54 MIL/uL (ref 3.87–5.11)
RDW: 13.3 % (ref 11.5–15.5)
WBC: 10.2 10*3/uL (ref 4.0–10.5)

## 2012-09-13 LAB — WET PREP FOR TRICH, YEAST, CLUE

## 2012-09-13 MED ORDER — NORGESTIMATE-ETH ESTRADIOL 0.25-35 MG-MCG PO TABS: 1.0000 | ORAL_TABLET | Freq: Every day | ORAL | Status: DC

## 2012-09-13 MED ORDER — FLUCONAZOLE 150 MG PO TABS: 150.0000 mg | ORAL_TABLET | Freq: Once | ORAL | Status: DC

## 2012-09-13 NOTE — Progress Notes (Signed)
[name] 1986/02/13 [id number]    History:    The patient presents for annual exam. Monthly cycle on Ortho-Cyclen. LGSIL/CIN-1 2006, 2009 with negative colposcopy. 08/2011 with positive HR HPV with a negative colposcopy. Mother with breast cancer diagnosed at age 28 with negative BRCA. Gardasil series completed. Anxiety issues have resolved. Pilonidal cyst surgery 08/2011 has had problems with drainage and wound not closing. Has followup next month with Dr. Luisa Hart.   Past medical history, past surgical history, family history and social history were all reviewed and documented in the EPIC chart. Remarried last month, traveler's diarrhea treated with Cipro on honeymoon. 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   ROS:  A  ROS was performed and pertinent positives and negatives are included in the history.  Exam:  Filed Vitals:   09/13/12 0935  BP: 114/70    General appearance:  Normal Head/Neck:  Normal, without cervical or supraclavicular adenopathy. Thyroid:  Symmetrical, normal in size, without palpable masses or nodularity. Respiratory  Effort:  Normal  Auscultation:  Clear without wheezing or rhonchi Cardiovascular  Auscultation:  Regular rate, without rubs, murmurs or gallops  Edema/varicosities:  Not grossly evident Abdominal  Soft,nontender, without masses, guarding or rebound.  Liver/spleen:  No organomegaly noted  Hernia:  None appreciated  Skin  Inspection:  Half-inch open area erythemic pilonidal cyst  Palpation:  Grossly normal Neurologic/psychiatric  Orientation:  Normal with appropriate conversation.  Mood/affect:  Normal  Genitourinary    Breasts: Examined lying and sitting.     Right: Without masses, retractions, discharge or axillary adenopathy.     Left: Without masses, retractions, discharge or axillary adenopathy.   Inguinal/mons:  Normal without inguinal adenopathy  External genitalia:  Normal  BUS/Urethra/Skene's glands:   Normal  Bladder:  Normal  Vagina:  Normal  Cervix:  Normal  Uterus:   normal in size, shape and contour.  Midline and mobile  Adnexa/parametria:     Rt: Without masses or tenderness.   Lt: Without masses or tenderness.  Anus and perineum: Normal    Assessment/Plan:  26 y.o.M. WF G0  for annual exam.     LGSIL/CIN-1 with  Positive HR HPV 08/2011 with negative colposcopy Yeast Open/draining pilonidal cyst since surgery 08/2011 Mother breast cancer age 79 with negative BRCA  Plan: Annual mammogram, will schedule. Diflucan 150 by mouth times one dose with 1 refill. Instructed to call if no relief. Keep scheduled followup appointment for pilonidal cyst. SBE's, continue regular exercise, calcium rich diet, MVI daily encouraged. Reviewed importance of decreasing calories for weight loss has had steady increase. Ortho-Cyclen prescription, proper use given and reviewed slight risk for blood clots and strokes. CBC, UA, Pap.     Harrington Challenger [facility], 10:28 AM 09/13/2012

## 2012-09-13 NOTE — Patient Instructions (Signed)

## 2012-09-13 NOTE — Addendum Note (Signed)
Addended by: Richardson Chiquito on: 09/13/2012 12:20 PM   Modules accepted: Orders

## 2012-09-14 LAB — URINALYSIS W MICROSCOPIC + REFLEX CULTURE
Bilirubin Urine: NEGATIVE
Casts: NONE SEEN
Glucose, UA: NEGATIVE mg/dL
Ketones, ur: NEGATIVE mg/dL
Specific Gravity, Urine: 1.023 (ref 1.005–1.030)
pH: 5.5 (ref 5.0–8.0)

## 2012-09-18 ENCOUNTER — Other Ambulatory Visit: Payer: Self-pay | Admitting: Women's Health

## 2012-09-18 MED ORDER — NITROFURANTOIN MONOHYD MACRO 100 MG PO CAPS: 100.0000 mg | ORAL_CAPSULE | Freq: Two times a day (BID) | ORAL | Status: DC

## 2012-09-22 ENCOUNTER — Telehealth: Payer: Self-pay | Admitting: *Deleted

## 2012-09-22 NOTE — Telephone Encounter (Signed)
Pt was prescribed Macrobid 100 mg x 7 days on 09/18/12 pt said she now has white discharge, has rx for diflucan from [facility]. Pt informed after taking Rx take diflucan pill. Pt will follow up as needed.

## 2012-10-02 ENCOUNTER — Ambulatory Visit (INDEPENDENT_AMBULATORY_CARE_PROVIDER_SITE_OTHER): Payer: BC Managed Care – PPO | Admitting: Surgery

## 2012-10-02 ENCOUNTER — Encounter (INDEPENDENT_AMBULATORY_CARE_PROVIDER_SITE_OTHER): Payer: Self-pay | Admitting: Surgery

## 2012-10-02 VITALS — BP 122/80 | HR 70 | Temp 97.6°F | Resp 15 | Ht 70.0 in | Wt 203.2 lb

## 2012-10-02 DIAGNOSIS — L988 Other specified disorders of the skin and subcutaneous tissue: Secondary | ICD-10-CM

## 2012-10-02 NOTE — Patient Instructions (Signed)
Keep dressing in place for 3 days.  Sponge bath during that time.  Return 1 week.  Cover with dry dressing once you remove dressing. Ok to shower in 3 days.

## 2012-10-02 NOTE — Progress Notes (Signed)
Patient returns after excision of a large infected pilonidal cyst 08/2011.  She feels well. Its causing no problems.  Very slow healing but pt is asymptomatic. Here today to try A cell dressing application to speed up healing instead of excision.    Exam: PILONIDAL wound is approximately 2 x 1 x 0.5  cm. It is clean. There is some exudate but No signs of invasive infection.200 mg Matristem matrix applied after using Q tip for abrasion of wound.  7 cm x 10 cm wound matrix and vasoline gauze applied. Pt tolerated well.  Impression: Status post excision of infected pilonidal cyst complex with complex wound with very slow healing bur 1 cm smaller compared to 10 weeks ago.  Application of a cell today.   Plan: return 1 week.  Keep dressing in place for 3 days then remove,  Shower and cover with dry dressing.  RTC 1 week for possible reapplication.  Rep available for assistance.

## 2012-10-09 ENCOUNTER — Encounter (INDEPENDENT_AMBULATORY_CARE_PROVIDER_SITE_OTHER): Payer: Self-pay | Admitting: Surgery

## 2012-10-09 ENCOUNTER — Ambulatory Visit (INDEPENDENT_AMBULATORY_CARE_PROVIDER_SITE_OTHER): Payer: Self-pay | Admitting: Surgery

## 2012-10-09 VITALS — BP 110/70 | HR 78 | Temp 97.7°F | Resp 18 | Ht 70.0 in | Wt 204.4 lb

## 2012-10-09 DIAGNOSIS — L988 Other specified disorders of the skin and subcutaneous tissue: Secondary | ICD-10-CM

## 2012-10-09 NOTE — Progress Notes (Signed)
Patient returns after excision of a large infected pilonidal cyst 08/2011.  She feels well. Its causing no problems.  Very slow healing but pt is asymptomatic. Here today to try A cell dressing application to speed up healing instead of excision.    Exam: PILONIDAL wound is approximately 2 x 1 x 0.5  cm. It is clean. There is some exudate but No signs of invasive infection.  200 mg Matristem matrix applied after using Q tip for abrasion of wound and vasoline gauze applied. Pt tolerated well.  Impression: pilonidal disease  Status post excision of infected pilonidal cyst complex with complex wound with very slow healing bur 1 cm smaller compared to 10 weeks ago.  Application of A cell today.   Plan: return 1 week.  Keep dressing in place for 3 days then remove,  Shower and cover with dry dressing.  RTC 1 week for possible reapplication.  Rep available for assistance.

## 2012-10-09 NOTE — Patient Instructions (Signed)
Return next wek.  Keep dry for 24 hours and avoid scrubbing wound.  Can replace gauze as needed.

## 2012-10-17 ENCOUNTER — Ambulatory Visit (INDEPENDENT_AMBULATORY_CARE_PROVIDER_SITE_OTHER): Payer: Self-pay | Admitting: Surgery

## 2012-10-17 ENCOUNTER — Encounter (INDEPENDENT_AMBULATORY_CARE_PROVIDER_SITE_OTHER): Payer: Self-pay | Admitting: Surgery

## 2012-10-17 VITALS — BP 112/78 | HR 84 | Resp 14 | Ht 70.0 in | Wt 203.0 lb

## 2012-10-17 DIAGNOSIS — Z872 Personal history of diseases of the skin and subcutaneous tissue: Secondary | ICD-10-CM | POA: Insufficient documentation

## 2012-10-17 NOTE — Patient Instructions (Signed)
Return 2 weeks.  Leave A Cell on for 36 hours if possible.

## 2012-10-17 NOTE — Progress Notes (Signed)
Patient returns after excision of a large infected pilonidal cyst 08/2011.  She feels well. Its causing no problems.  Very slow healing but pt is asymptomatic. Here today to try A cell dressing application to speed up healing instead of excision.    Exam: PILONIDAL wound is approximately 1.8 x 1 x 0.3  cm. It is clean. There is some exudate but No signs of invasive infection.  200 mg Matristem matrix applied after using Q tip for abrasion of wound and vasoline gauze applied. Pt tolerated well.  Impression: pilonidal disease  Status post excision of infected pilonidal cyst complex with complex wound with very slow healing bur 1 cm smaller compared to 10 weeks ago.  Application of A cell today.   Plan: return 1 week.  Keep dressing in place for 3 days then remove,  Shower and cover with dry dressing.  RTC 2 weeks.

## 2012-10-30 ENCOUNTER — Ambulatory Visit (INDEPENDENT_AMBULATORY_CARE_PROVIDER_SITE_OTHER): Payer: Self-pay | Admitting: Surgery

## 2012-10-30 ENCOUNTER — Encounter (INDEPENDENT_AMBULATORY_CARE_PROVIDER_SITE_OTHER): Payer: Self-pay | Admitting: Surgery

## 2012-10-30 VITALS — BP 118/66 | HR 84 | Temp 98.4°F | Resp 14 | Ht 70.0 in | Wt 203.8 lb

## 2012-10-30 DIAGNOSIS — L988 Other specified disorders of the skin and subcutaneous tissue: Secondary | ICD-10-CM

## 2012-10-30 NOTE — Progress Notes (Signed)
Patient returns after excision of a large infected pilonidal cyst 08/2011.  She feels well. Its causing no problems.  Very slow healing but pt is asymptomatic. Here today to try A cell dressing application to speed up healing instead of excision.    Exam: PILONIDAL wound is approximately 1.6 x 1 x 0.3  cm. It is clean. There is some exudate but No signs of invasive infection.  200 mg Matristem matrix applied after using Q tip for abrasion of wound and vasoline gauze applied. Pt tolerated well.  Impression: pilonidal disease  Status post excision of infected pilonidal cyst complex with complex wound with very slow healing bur 1 cm smaller compared to 10 weeks ago.  Application of A cell today.   Plan: return 3 weekS.  Keep dressing in place for 3 days then remove,  Shower and cover with dry dressing.  RTC 2 weeks.

## 2012-10-30 NOTE — Patient Instructions (Signed)
RETURN 3 WEEKS.  FOLLOW PREVIOUS WOUND CARE INSTRUCTIONS.

## 2012-12-04 ENCOUNTER — Encounter (INDEPENDENT_AMBULATORY_CARE_PROVIDER_SITE_OTHER): Payer: BC Managed Care – PPO | Admitting: Surgery

## 2012-12-11 ENCOUNTER — Ambulatory Visit (INDEPENDENT_AMBULATORY_CARE_PROVIDER_SITE_OTHER): Payer: BC Managed Care – PPO | Admitting: Surgery

## 2012-12-11 ENCOUNTER — Encounter (INDEPENDENT_AMBULATORY_CARE_PROVIDER_SITE_OTHER): Payer: Self-pay | Admitting: Surgery

## 2012-12-11 VITALS — BP 116/68 | HR 72 | Temp 98.4°F | Resp 14 | Ht 70.0 in | Wt 198.5 lb

## 2012-12-11 DIAGNOSIS — L988 Other specified disorders of the skin and subcutaneous tissue: Secondary | ICD-10-CM

## 2012-12-11 NOTE — Patient Instructions (Signed)
Refer to wound clinic for opinion.  Will probably need debridement.  Return after to set up treatment plan.

## 2012-12-11 NOTE — Progress Notes (Signed)
Patient returns after excision of a large infected pilonidal cyst 08/2011.  She feels well. Its causing no problems.  Very slow healing but pt is asymptomatic.     Exam: PILONIDAL wound is approximately 3 cm x2  x 0.3  cm. It is clean. There is some exudate but No signs of invasive infection.    Impression: pilonidal disease  Status post excision of infected pilonidal cyst complex with complex wound with very slow healing   Plan:patient with no increasing wound healing. Will refer to wound clinic. Will need debridement more than likely since this is no longer healing well. Do not feel any further application of a cell in this setting. Return after visiting wound center for opinion

## 2012-12-12 ENCOUNTER — Telehealth (INDEPENDENT_AMBULATORY_CARE_PROVIDER_SITE_OTHER): Payer: Self-pay | Admitting: *Deleted

## 2012-12-12 NOTE — Telephone Encounter (Signed)
I spoke with pt to inform her of appt at [facility] wound care center on 12/22/12 at 1:30pm.  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

## 2012-12-18 ENCOUNTER — Ambulatory Visit: Payer: BC Managed Care – PPO

## 2012-12-18 ENCOUNTER — Encounter (HOSPITAL_BASED_OUTPATIENT_CLINIC_OR_DEPARTMENT_OTHER): Payer: BC Managed Care – PPO | Attending: Plastic Surgery

## 2012-12-18 DIAGNOSIS — S21209A Unspecified open wound of unspecified back wall of thorax without penetration into thoracic cavity, initial encounter: Secondary | ICD-10-CM | POA: Insufficient documentation

## 2012-12-18 DIAGNOSIS — Y838 Other surgical procedures as the cause of abnormal reaction of the patient, or of later complication, without mention of misadventure at the time of the procedure: Secondary | ICD-10-CM | POA: Insufficient documentation

## 2012-12-19 ENCOUNTER — Encounter (HOSPITAL_BASED_OUTPATIENT_CLINIC_OR_DEPARTMENT_OTHER): Payer: BC Managed Care – PPO

## 2012-12-19 NOTE — Progress Notes (Signed)
[facility]  [name], TAT               ACCOUNT NO.:  [id number]  MEDICAL RECORD NO.:  [id number]      DATE OF BIRTH:  June 05, 1985  PHYSICIAN:  Wayland Denis, DO       VISIT DATE:  12/18/2012                                  OFFICE VISIT   HISTORY:  The patient is a 27 year old female who has been dealing with a pilonidal cyst for several years.  She has undergone excision with General Surgery and now has fairly superficial wound just above her anus.  The wound is 6.5 x 1.5 x 0.1 cm.  It does not appear to be infected.  There is no malodor.  PAST MEDICAL HISTORY:  Positive for right shoulder surgery, pilonidal cyst surgery.  No chronic conditions.  MEDICATIONS:  Birth control.  ALLERGIES:  She does not have any allergies.  REVIEW OF SYSTEMS:  Otherwise, negative.  PHYSICAL EXAMINATION:  GENERAL:  She is alert, oriented, cooperative, not in any acute distress.  She is very pleasant. HEENT:  Pupils were equal.  She does not have any cervical lymphadenopathy. CHEST:  Her breathing is unlabored. CARDIOVASCULAR:  Heart rate is regular.  The wound is described above and is clean.  There does not appear to be any sign of infection.  The wound does appear to be somewhat hypergranulated, fragile, and in a chronic state.  What I like to do is try some silver alginate, sitz bath, multivitamin, vitamin C, Zinc. Check a prealbumin and if we do not start to see signs of improvement, she may have to consider a possibility of a flap and she understands that.  She wants to try local care if at all possible prior to anything else.     Wayland Denis, DO     CS/MEDQ  D:  12/18/2012  T:  12/19/2012  Job:  [id number]

## 2012-12-25 ENCOUNTER — Encounter (HOSPITAL_BASED_OUTPATIENT_CLINIC_OR_DEPARTMENT_OTHER): Payer: BC Managed Care – PPO

## 2012-12-28 ENCOUNTER — Other Ambulatory Visit: Payer: Self-pay

## 2013-01-01 ENCOUNTER — Encounter (HOSPITAL_BASED_OUTPATIENT_CLINIC_OR_DEPARTMENT_OTHER): Payer: BC Managed Care – PPO | Attending: Plastic Surgery

## 2013-01-01 DIAGNOSIS — L0591 Pilonidal cyst without abscess: Secondary | ICD-10-CM | POA: Insufficient documentation

## 2013-01-01 NOTE — Progress Notes (Signed)
[facility]  NAMESHARLYNE, [name]               ACCOUNT NO.:  [id number]  MEDICAL RECORD NO.:  [id number]      DATE OF BIRTH:  1986-01-28  PHYSICIAN:  Wayland Denis, DO       VISIT DATE:  01/01/2013                                  OFFICE VISIT   The patient is a 27 year old female, who is here for followup on her pilonidal cyst wound that is chronic.  Her pre-albumin came back at 24.9, so this is encouraging.  She has been using a silver alginate. She has not done any sitz baths, yet overall she is feeling a little bit better.  The wound is a little smaller and not quite as deep in appearance, but is slow to progress.  There has been no change in her medications or social history.  REVIEW OF SYSTEMS:  Otherwise negative.  PHYSICAL EXAMINATION:  On exam, she is alert, oriented, cooperative, not in any acute distress.  She is very pleasant.  Pupils are equal. Extraocular muscles are intact.  Her breathing is unlabored.  Her heart rate is regular.  The wound is described above.  It does not appear to be infected.  We will plan for continuing with the silver alginate, encouraged her to take soaks with Dial soap in the bath for 5-10 minutes to continue with focusing on nutrition and staying off the area.  We did discuss the possibility of flaps, and we will see her back in 1 week.     Wayland Denis, DO     CS/MEDQ  D:  01/01/2013  T:  01/01/2013  Job:  [id number]

## 2013-01-08 NOTE — Progress Notes (Signed)
[facility]  [name], [name]               ACCOUNT NO.:  [id number]  MEDICAL RECORD NO.:  [id number]      DATE OF BIRTH:  [birth year]  PHYSICIAN:  Wayland Denis, DO            VISIT DATE:                                  OFFICE VISIT   The patient is a 27 year old female with a chronic pilonidal cyst.  We have been using silver alginate.  There is a little bit improvement in the overall appearance of the wound, although the sizes are not significantly changed.  There has been no change in her medications or social history.  REVIEW OF SYSTEMS:  Otherwise negative.  PHYSICAL EXAMINATION:  On exam, she is alert, oriented, cooperative, not in any acute distress.  She is very pleasant as usual.  Her breathing is unlabored.  Her heart rate is regular.  We had a discussion about some of the things that she was doing at home and she has not done the sitz bath or increase her protein, so we have encouraged her to do a protein shake at least once a day, sitz bath at least once a day, and continue with the silver alginate.  Continue with multivitamin, vitamin C, zinc, and try and stay off of the area as much as possible.     Wayland Denis, DO     CS/MEDQ  D:  01/08/2013  T:  01/08/2013  Job:  [id number]

## 2013-01-15 NOTE — Progress Notes (Signed)
[facility]  NAMEZEPPELIN, BECKSTRAND               ACCOUNT NO.:  [id number]  MEDICAL RECORD NO.:  [id number]      DATE OF BIRTH:  08/29/1985  PHYSICIAN:  Wayland Denis, DO       VISIT DATE:  01/15/2013                                  OFFICE VISIT   The patient is a 27 year old female, who has a pilonidal cyst that is chronic.  She has undergone debridement in the past and excision.  She presents for further care.  She has finally started to do the soaks in the protein and this has made a tremendous difference with marked improvement in the depth and the overall size, it is starting to granulate and epithelialized.  There has been no change in her medications or social history.  On exam, she is alert, oriented, cooperative, not in any acute distress. Her breathing is unlabored.  Her heart rate is regular.  The wound is markedly improved.  We will continue with soaks daily with Dial soap, protein offloading, multivitamin, vitamin C, zinc and Silvercel.     Wayland Denis, DO     CS/MEDQ  D:  01/15/2013  T:  01/15/2013  Job:  [id number]

## 2013-01-22 ENCOUNTER — Encounter (HOSPITAL_BASED_OUTPATIENT_CLINIC_OR_DEPARTMENT_OTHER): Payer: BC Managed Care – PPO | Attending: Plastic Surgery

## 2013-01-22 DIAGNOSIS — L0591 Pilonidal cyst without abscess: Secondary | ICD-10-CM | POA: Insufficient documentation

## 2013-01-22 NOTE — Progress Notes (Signed)
[facility]  [name], [name]               ACCOUNT NO.:  [id number]  MEDICAL RECORD NO.:  [id number]      DATE OF BIRTH:  02-08-1986  PHYSICIAN:  Wayland Denis, DO       VISIT DATE:  01/22/2013                                  OFFICE VISIT   HISTORY OF PRESENT ILLNESS:  The patient is a 27 year old female, who is here for followup on her chronic pilonidal cyst.  She has been using the silver alginate on the area and for the last 2 weeks she has been doing baths with Dial soap.  This seems to be providing very good improvement. She also has increased her protein shakes.  There has been no change in her medications or social history.  PHYSICAL EXAMINATION:  On exam, she is alert, oriented, cooperative, not in any acute distress.  She is pleasant.  Pupils are equal.  Extraocular muscles are intact.  No cervical lymphadenopathy.  Her breathing is unlabored.  Her heart rate is regular.  The wound is showing very good signs of improvement with epithelialization mostly in the middle, very little drainage, and no sign of infection.  Therefore, we will switch her to a collagen and see how she does with that over the next week.  In the meantime, I encouraged her to continue with a multivitamin, vitamin C, zinc, and protein shakes offloading, and essentially sitz baths.     Wayland Denis, DO     CS/MEDQ  D:  01/22/2013  T:  01/22/2013  Job:  [id number]

## 2013-01-30 NOTE — Progress Notes (Signed)
[facility]  NAMELILLIAUNA, [name]               ACCOUNT NO.:  [id number]  MEDICAL RECORD NO.:  [id number]      DATE OF BIRTH:  Jan 31, 1986  PHYSICIAN:  Wayland Denis, DO       VISIT DATE:  01/29/2013                                  OFFICE VISIT   HISTORY OF PRESENT ILLNESS:  The patient is a 27 year old female, who is here for followup on her chronic pilonidal cyst.  She is doing well with her protein and her vitamins, sitz bath, and offloading.  There has been no change in her medications or social history.  REVIEW OF SYSTEMS:  Otherwise negative.  PHYSICAL EXAMINATION:  On exam, she is alert, oriented, cooperative, not in any acute distress.  She is pleasant.  Pupils are equal.  Extraocular muscles are intact.  No tenderness.  The wound is about half the size as it was last week.  PLAN:  We will continue with the collagen offloading, protein, and we will see her back in a week.     Wayland Denis, DO     CS/MEDQ  D:  01/29/2013  T:  01/30/2013  Job:  [id number]

## 2013-02-06 NOTE — Progress Notes (Signed)
[facility]  [name], [name]               ACCOUNT NO.:  [id number]  MEDICAL RECORD NO.:  [id number]      DATE OF BIRTH:  Sep 21, 1985  PHYSICIAN:  Wayland Denis, DO       VISIT DATE:  02/05/2013                                  OFFICE VISIT   The patient is a 27 year old female who is here for followup on her chronic pilonidal cyst.  She is doing extremely well.  She has been using collagen, not staying off it as much as she should, but she is doing the protein.  School should be ending this week and she will have 2-week break.  There has been no change in her medications or social history.  PHYSICAL EXAMINATION:  She is alert, oriented, cooperative, not in any acute distress.  The area has markedly improved with a decrease in depth and overall dimensions, those are noted in the chart.  Recommend continuing with sitz bath, multivitamin, vitamin C, zinc, protein, and offloading especially when school is out to try and prevent sitting as much as possible.     Wayland Denis, DO     CS/MEDQ  D:  02/05/2013  T:  02/06/2013  Job:  [id number]

## 2013-02-12 NOTE — Progress Notes (Signed)
[facility]  NAMEBRAYLINN, [name]               ACCOUNT NO.:  [id number]  MEDICAL RECORD NO.:  [id number]      DATE OF BIRTH:  04/04/1985  PHYSICIAN:  [name] Denis, DO       VISIT DATE:  02/12/2013                                  OFFICE VISIT   The patient is a 27 year old female who is here for followup on her pilonidal cyst, chronic.  She has been placing collagen on the area. She is now on holiday break from school, so she will have 2 weeks where she can stay off the sacral area better.  There has been no change in her medications or social history.  On exam, she is alert, oriented.  She is very pleasant like usual. Little bit tired today.  Her breathing is unlabored.  Her heart rate is regular.  The wound is not much changed from last week, still superficial but not smaller.  Recommend offloading, protein, multivitamin, vitamin C, sitz baths, and staying off it as much as possible over the next few weeks.  We will continue with the collagen.     [name] Denis, DO     CS/MEDQ  D:  02/12/2013  T:  02/12/2013  Job:  [id number]

## 2013-02-20 NOTE — Progress Notes (Signed)
[facility]  NAMEDENINE, BROTZ               ACCOUNT NO.:  [id number]  MEDICAL RECORD NO.:  [id number]      DATE OF BIRTH:  [birth year]  PHYSICIAN:  Wayland Denis, DO            VISIT DATE:                                  OFFICE VISIT   The patient is a 27 year old female, who is here for followup on her pilonidal cyst.  She has been home and off of school.  We had switched her from alginate to collagen and this appears to have irritated her with drainage around the wound with a little bit of an opening of the wound and is a slight bit deeper as well.  There has been no change in her medications or social history.  REVIEW OF SYSTEMS:  Negative.  PHYSICAL EXAMINATION:  On exam, she is alert, oriented, cooperative, not in any acute distress.  She is a little tired, but pleasant.  Her breathing is unlabored.  Her heart rate is regular.  Her skin is warm. Her pulses are strong.  The wound is described above.  We will do __________ around the periwound area and alginate to the wound.  In the meantime, continue with protein offloading and multivitamin, vitamin C, zinc.  We will see her back in a week.     Wayland Denis, DO     CS/MEDQ  D:  02/19/2013  T:  02/19/2013  Job:  [id number]

## 2013-02-26 ENCOUNTER — Encounter (HOSPITAL_BASED_OUTPATIENT_CLINIC_OR_DEPARTMENT_OTHER): Payer: BC Managed Care – PPO | Attending: Plastic Surgery

## 2013-02-26 DIAGNOSIS — S31809A Unspecified open wound of unspecified buttock, initial encounter: Secondary | ICD-10-CM | POA: Insufficient documentation

## 2013-02-26 DIAGNOSIS — X58XXXA Exposure to other specified factors, initial encounter: Secondary | ICD-10-CM | POA: Insufficient documentation

## 2013-02-26 DIAGNOSIS — L0591 Pilonidal cyst without abscess: Secondary | ICD-10-CM | POA: Insufficient documentation

## 2013-02-26 NOTE — Progress Notes (Signed)
[facility]  NAMMady [name]:  [name], [name]               ACCOUNT NO.:  [id number]  MEDICAL RECORD NO.:  [id number]      DATE OF BIRTH:  November 01, 1985  PHYSICIAN:  Wayland Denislaire Sanger, DO            VISIT DATE:                                  OFFICE VISIT   The patient is a 28 year old female, who is here for followup on her chronic pilonidal cyst.  She is doing better this week than she was last week.  There is a little less irritation and the wound is decreased in size.  There is a little bit of crack in the more cephalad portion of the midline, and this may be due to little irritation and drainage.  It seems like the cream on the periwound area has helped and the alginate, so we will continue with that.  No change in her medications.  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  She is still doing the protein shakes and vitamins.  REVIEW OF SYSTEMS:  Negative.  PHYSICAL EXAMINATION:  GENERAL:  On exam, she is alert, oriented, cooperative, not in any acute distress.  She is pleasant. EYES:  Pupils are equal. CHEST:  Breathing is unlabored. HEART:  Heart rate is regular.  Wound is as described above.  We will continue with the periwound cream and a silver alginate and we will see her back in a week, encouraged her to continue with protein offloading and vitamins.     Wayland Denislaire Sanger, DO     CS/MEDQ  D:  02/26/2013  T:  02/26/2013  Job:  [id number]

## 2013-03-05 NOTE — Progress Notes (Signed)
[facility]  NAMMady Gemma:  Chambless, Rand               ACCOUNT NO.:  [id number]  MEDICAL RECORD NO.:  [id number]      DATE OF BIRTH:  Oct 13, 1985  PHYSICIAN:  Wayland Denislaire Sanger, DO       VISIT DATE:  03/05/2013                                  OFFICE VISIT   The patient is a 28 year old female, who is here for a followup on her chronic pilonidal cyst.  She was back to work this past week and has been up on her feet most of the time.  As a result, her wound looks tremendously better.  There has been no change in her medications or social history.  REVIEW OF SYSTEMS:  Negative.  PHYSICAL EXAMINATION:  On exam, she is alert, oriented, and cooperative. She is very pleasant and seems to be enjoying being back at work.  Her pupils are equal.  Her breathing is unlabored.  Her heart rate is regular.  The wound is smaller than it was last week both in size and depth, and the periwound area is less irritated.  We will continue with the silver alginate and zinc offloading, multivitamin, vitamin C, and follow up in 1 week hopefully.     Wayland Denislaire Sanger, DO     CS/MEDQ  D:  03/05/2013  T:  03/05/2013  Job:  [id number]

## 2013-03-12 NOTE — Progress Notes (Signed)
[facility]  NAMMady [name]:  [name], [name]               ACCOUNT NO.:  [id number]  MEDICAL RECORD NO.:  [id number]      DATE OF BIRTH:  May 04, 1985  PHYSICIAN:  [name] Denislaire Sanger, DO       VISIT DATE:  03/12/2013                                  OFFICE VISIT   The patient is a 28 year old female, who is here for followup on her chronic pilonidal cyst.  She has been using silver alginate with marked improvement and as of today is healed.  The periwound area is much better as well.  She is back at work, except for today is a holiday.  REVIEW OF SYSTEMS:  Negative.  PHYSICAL EXAMINATION:  On exam, she is alert, cooperative and pleasant. Her breathing is unlabored.  The wound is described above.  We will put silver Alginate on the stay.  She is to continue with the protein multivitamin, vitamin C, and follow up as needed.     [name] Denislaire Sanger, DO     CS/MEDQ  D:  03/12/2013  T:  03/12/2013  Job:  [id number]

## 2013-10-03 ENCOUNTER — Encounter: Payer: Self-pay | Admitting: Women's Health

## 2013-10-03 ENCOUNTER — Ambulatory Visit (INDEPENDENT_AMBULATORY_CARE_PROVIDER_SITE_OTHER): Payer: BC Managed Care – PPO | Admitting: Women's Health

## 2013-10-03 ENCOUNTER — Other Ambulatory Visit (HOSPITAL_COMMUNITY)
Admission: RE | Admit: 2013-10-03 | Discharge: 2013-10-03 | Disposition: A | Discharge status: Home / Self Care | Payer: BC Managed Care – PPO | Source: Ambulatory Visit | Attending: Women's Health | Admitting: Women's Health

## 2013-10-03 VITALS — BP 120/76 | Ht 69.0 in | Wt 206.0 lb

## 2013-10-03 DIAGNOSIS — Z1151 Encounter for screening for human papillomavirus (HPV): Secondary | ICD-10-CM | POA: Insufficient documentation

## 2013-10-03 DIAGNOSIS — Z124 Encounter for screening for malignant neoplasm of cervix: Secondary | ICD-10-CM | POA: Diagnosis not present

## 2013-10-03 DIAGNOSIS — Z01419 Encounter for gynecological examination (general) (routine) without abnormal findings: Secondary | ICD-10-CM

## 2013-10-03 LAB — CBC WITH DIFFERENTIAL/PLATELET
BASOS PCT: 0 % (ref 0–1)
Basophils Absolute: 0 10*3/uL (ref 0.0–0.1)
Eosinophils Absolute: 0.4 10*3/uL (ref 0.0–0.7)
Eosinophils Relative: 5 % (ref 0–5)
HEMATOCRIT: 40.4 % (ref 36.0–46.0)
HEMOGLOBIN: 13.6 g/dL (ref 12.0–15.0)
LYMPHS ABS: 3.4 10*3/uL (ref 0.7–4.0)
LYMPHS PCT: 41 % (ref 12–46)
MCH: 28.4 pg (ref 26.0–34.0)
MCHC: 33.7 g/dL (ref 30.0–36.0)
MCV: 84.3 fL (ref 78.0–100.0)
MONO ABS: 0.8 10*3/uL (ref 0.1–1.0)
MONOS PCT: 10 % (ref 3–12)
NEUTROS ABS: 3.6 10*3/uL (ref 1.7–7.7)
Neutrophils Relative %: 44 % (ref 43–77)
Platelets: 334 10*3/uL (ref 150–400)
RBC: 4.79 MIL/uL (ref 3.87–5.11)
RDW: 13.3 % (ref 11.5–15.5)
WBC: 8.2 10*3/uL (ref 4.0–10.5)

## 2013-10-03 LAB — URINALYSIS W MICROSCOPIC + REFLEX CULTURE
Bilirubin Urine: NEGATIVE
CRYSTALS: NONE SEEN
Casts: NONE SEEN
Glucose, UA: NEGATIVE mg/dL
KETONES UR: NEGATIVE mg/dL
Leukocytes, UA: NEGATIVE
NITRITE: NEGATIVE
Protein, ur: NEGATIVE mg/dL
SPECIFIC GRAVITY, URINE: 1.022 (ref 1.005–1.030)
UROBILINOGEN UA: 0.2 mg/dL (ref 0.0–1.0)
pH: 5.5 (ref 5.0–8.0)

## 2013-10-03 NOTE — Progress Notes (Signed)
[name] 09-16-1985 [id number]    History:    Presents for annual exam.  Regular monthly cycle, stopped OC's 4 months ago and is trying to conceive. 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 Gardasil series completed. Mother breast cancer age 29/surviror  with negative BRCA. Currently on Zoloft 100 per psychiatrist.  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 Numerous family members with depression, OCD.  ROS:  A  12 point ROS was performed and pertinent positives and negatives are included.  Exam:  Filed Vitals:   10/03/13 0812  BP: 120/76    General appearance:  Normal Thyroid:  Symmetrical, normal in size, without palpable masses or nodularity. Respiratory  Auscultation:  Clear without wheezing or rhonchi Cardiovascular  Auscultation:  Regular rate, without rubs, murmurs or gallops  Edema/varicosities:  Not grossly evident Abdominal  Soft,nontender, without masses, guarding or rebound.  Liver/spleen:  No organomegaly noted  Hernia:  None appreciated  Skin  Inspection:  Grossly normal   Breasts: Examined lying and sitting.     Right: Without masses, retractions, discharge or axillary adenopathy.     Left: Without masses, retractions, discharge or axillary adenopathy. Gentitourinary   Inguinal/mons:  Normal without inguinal adenopathy  External genitalia:  Normal  BUS/Urethra/Skene's glands:  Normal  Vagina:  Normal  Cervix:  Normal  Uterus:   normal in size, shape and contour.  Midline and mobile  Adnexa/parametria:     Rt: Without masses or tenderness.   Lt: Without masses or tenderness.  Anus and perineum: Normal-hemorrhoids    Assessment/Plan:  28 y.o.MWF G0  for annual exam desiring conception.  History of LGSIL with positive HR HPV, 08/2011 negative colposcopy and biopsy Anxiety/OCD - Zoloft 100 per   Psychiatrist   plan: CBC, UA, Pap. Conceptual timing reviewed, increased frequency. SBE's, regular exercise, calcium rich diet, prenatal vitamin daily encouraged. Reviewed discussing Zoloft dose with psychiatrist and possibly decreasing dose with pregnancy and preconceptually. Safe pregnancy behaviors reviewed. Aware we no longer deliver, return to office for viability ultrasound.   Note: This dictation was prepared with Dragon/digital dictation.  Any transcriptional errors that result are unintentional. Huel Cote [facility], 9:01 AM 10/03/2013

## 2013-10-03 NOTE — Patient Instructions (Signed)
Preparing for Pregnancy Before trying to become pregnant, make an appointment with your health care provider (preconception care). The goal is to help you have a healthy, safe pregnancy. At your first appointment, your health care provider will:   Do a complete physical exam, including a Pap test.  Take a complete medical history.  Give you advice and help you resolve any problems. PRECONCEPTION CHECKLIST Here is a list of the basics to cover with your health care provider at your preconception visit:  Medical history.  Tell your health care provider about any diseases you have had. Many diseases can affect your pregnancy.  Include your partner's medical history and family history.  Make sure you have been tested for sexually transmitted infections (STIs). These can affect your pregnancy. In some cases, they can be passed to your baby. Tell your health care provider about any history of STIs.  Make sure your health care provider knows about any previous problems you have had with conception or pregnancy.  Tell your health care provider about any medicine you take. This includes herbal supplements and over-the-counter medicines.  Make sure all your immunizations are up to date. You may need to make additional appointments.  Ask your health care provider if you need any vaccinations or if there are any you should avoid.  Diet.  It is especially important to eat a healthy, balanced diet with the right nutrients when you are pregnant.  Ask your health care provider to help you get to a healthy weight before pregnancy.  If you are overweight, you are at higher risk for certain complications. These include high blood pressure, diabetes, and preterm birth.  If you are underweight, you are more likely to have a low-birth-weight baby.  Lifestyle.  Tell your health care provider about lifestyle factors such as alcohol use, drug use, or smoking.  Describe any harmful substances you may  be exposed to at work or home. These can include chemicals, pesticides, and radiation.  Mental health.  Let your health care provider know if you have been feeling depressed or anxious.  Let your health care provider know if you have a history of substance abuse.  Let your health care provider know if you do not feel safe at home. HOME INSTRUCTIONS TO PREPARE FOR PREGNANCY Follow your health care provider's advice and instructions.   Keep an accurate record of your menstrual periods. This makes it easier for your health care provider to determine your baby's due date.  Begin taking prenatal vitamins and folic acid supplements daily. Take them as directed by your health care provider.  Eat a balanced diet. Get help from a nutrition counselor if you have questions or need help.  Get regular exercise. Try to be active for at least 30 minutes a day most days of the week.  Quit smoking, if you smoke.  Do not drink alcohol.  Do not take illegal drugs.  Get medical problems, such as diabetes or high blood pressure, under control.  If you have diabetes, make sure you do the following:  Have good blood sugar control. If you have type 1 diabetes, use multiple daily doses of insulin. Do not use split-dose or premixed insulin.  Have an eye exam by a qualified eye care professional trained in caring for people with diabetes.  Get evaluated by your health care provider for cardiovascular disease.  Get to a healthy weight. If you are overweight or obese, reduce your weight with the help of a qualified health   professional such as a registered dietitian. Ask your health care provider what the right weight range is for you. HOW DO I KNOW I AM PREGNANT? You may be pregnant if you have been sexually active and you miss your period. Symptoms of early pregnancy include:   Mild cramping.  Very light vaginal bleeding (spotting).  Feeling unusually tired.  Morning sickness. If you have any of  these symptoms, take a home pregnancy test. These tests look for a hormone called human chorionic gonadotropin (hCG) in your urine. Your body begins to make this hormone during early pregnancy. These tests are very accurate. Wait until at least the first day you miss your period to take one. If you get a positive result, call your health care provider to make appointments for prenatal care. WHAT SHOULD I DO IF I BECOME PREGNANT?  Make an appointment with your health care provider by week 12 of your pregnancy at the latest.  Do not smoke. Smoking can be harmful to your baby.  Do not drink alcoholic beverages. Alcohol is related to a number of birth defects.  Avoid toxic odors and chemicals.  You may continue to have sexual intercourse if it does not cause pain or other problems, such as vaginal bleeding. Document Released: 01/22/2008 Document Revised: 06/25/2013 Document Reviewed: 01/15/2013 ExitCare Patient Information 2015 [employer]. This information is not intended to replace advice given to you by your health care provider. Make sure you discuss any questions you have with your health care provider.  

## 2013-10-04 LAB — CYTOLOGY - PAP

## 2013-10-06 LAB — URINE CULTURE

## 2014-01-29 LAB — OB RESULTS CONSOLE GC/CHLAMYDIA
Chlamydia: NEGATIVE
GC PROBE AMP, GENITAL: NEGATIVE

## 2014-01-29 LAB — OB RESULTS CONSOLE RPR: RPR: NONREACTIVE

## 2014-01-29 LAB — OB RESULTS CONSOLE RUBELLA ANTIBODY, IGM: Rubella: IMMUNE

## 2014-01-29 LAB — OB RESULTS CONSOLE ABO/RH: RH Type: NEGATIVE

## 2014-01-29 LAB — OB RESULTS CONSOLE ANTIBODY SCREEN: Antibody Screen: NEGATIVE

## 2014-01-29 LAB — OB RESULTS CONSOLE HIV ANTIBODY (ROUTINE TESTING): HIV: NONREACTIVE

## 2014-01-29 LAB — OB RESULTS CONSOLE HEPATITIS B SURFACE ANTIGEN: Hepatitis B Surface Ag: NEGATIVE

## 2014-02-22 NOTE — L&D Delivery Note (Signed)
Delivery Note At 4:54 AM a healthy female was delivered via Vaginal, Spontaneous Delivery (Presentation: Right Occiput Anterior).  APGAR: 8, 9; weight 8 lb 5.7 oz (3790 g).   Placenta status: Intact, Spontaneous.  Cord: 3 vessels with the following complications: .  Cord pH: not done  Anesthesia: Epidural Local: Lidocaine 1% Episiotomy: None Lacerations: 2nd degree;Perineal Suture Repair: 2.0 3.0 Vicryl Est. Blood Loss (mL):  500  Mom to postpartum.  Baby to Couplet care / Skin to Skin.  Quindarrius Joplin,MARIE-LYNE 08/30/2014, 6:19 AM

## 2014-07-29 LAB — OB RESULTS CONSOLE GBS: GBS: POSITIVE

## 2014-08-29 ENCOUNTER — Encounter (HOSPITAL_COMMUNITY): Payer: Self-pay | Admitting: *Deleted

## 2014-08-29 ENCOUNTER — Inpatient Hospital Stay (HOSPITAL_COMMUNITY): Payer: BC Managed Care – PPO | Admitting: Anesthesiology

## 2014-08-29 ENCOUNTER — Inpatient Hospital Stay (HOSPITAL_COMMUNITY)
Admission: AD | Admit: 2014-08-29 | Discharge: 2014-09-01 | DRG: 775 | Disposition: A | Discharge status: Home / Self Care | Payer: BC Managed Care – PPO | Source: Ambulatory Visit | Attending: Obstetrics & Gynecology | Admitting: Obstetrics & Gynecology

## 2014-08-29 DIAGNOSIS — Z3490 Encounter for supervision of normal pregnancy, unspecified, unspecified trimester: Secondary | ICD-10-CM

## 2014-08-29 DIAGNOSIS — O99824 Streptococcus B carrier state complicating childbirth: Principal | ICD-10-CM | POA: Diagnosis present

## 2014-08-29 DIAGNOSIS — Z3A39 39 weeks gestation of pregnancy: Secondary | ICD-10-CM | POA: Diagnosis present

## 2014-08-29 DIAGNOSIS — O9081 Anemia of the puerperium: Secondary | ICD-10-CM | POA: Diagnosis present

## 2014-08-29 DIAGNOSIS — Z3403 Encounter for supervision of normal first pregnancy, third trimester: Secondary | ICD-10-CM | POA: Diagnosis present

## 2014-08-29 LAB — TYPE AND SCREEN
ABO/RH(D): A NEG
Antibody Screen: NEGATIVE

## 2014-08-29 LAB — URIC ACID: URIC ACID, SERUM: 4.6 mg/dL (ref 2.3–6.6)

## 2014-08-29 LAB — COMPREHENSIVE METABOLIC PANEL
ALBUMIN: 2.5 g/dL — AB (ref 3.5–5.0)
ALT: 27 U/L (ref 14–54)
ANION GAP: 5 (ref 5–15)
AST: 28 U/L (ref 15–41)
Alkaline Phosphatase: 195 U/L — ABNORMAL HIGH (ref 38–126)
BILIRUBIN TOTAL: 0.1 mg/dL — AB (ref 0.3–1.2)
BUN: 11 mg/dL (ref 6–20)
CHLORIDE: 107 mmol/L (ref 101–111)
CO2: 21 mmol/L — AB (ref 22–32)
Calcium: 8.8 mg/dL — ABNORMAL LOW (ref 8.9–10.3)
Creatinine, Ser: 0.75 mg/dL (ref 0.44–1.00)
GFR calc Af Amer: >60 mL/min (ref >60)
Glucose, Bld: 84 mg/dL (ref 65–99)
Potassium: 4 mmol/L (ref 3.5–5.1)
SODIUM: 133 mmol/L — AB (ref 135–145)
Total Protein: 6.4 g/dL — ABNORMAL LOW (ref 6.5–8.1)

## 2014-08-29 LAB — CBC
HCT: 32.7 % — ABNORMAL LOW (ref 36.0–46.0)
HEMOGLOBIN: 10.5 g/dL — AB (ref 12.0–15.0)
MCH: 24.7 pg — ABNORMAL LOW (ref 26.0–34.0)
MCHC: 32.1 g/dL (ref 30.0–36.0)
MCV: 76.9 fL — ABNORMAL LOW (ref 78.0–100.0)
PLATELETS: 222 10*3/uL (ref 150–400)
RBC: 4.25 MIL/uL (ref 3.87–5.11)
RDW: 14.1 % (ref 11.5–15.5)
WBC: 10.7 10*3/uL — ABNORMAL HIGH (ref 4.0–10.5)

## 2014-08-29 LAB — ABO/RH: ABO/RH(D): A NEG

## 2014-08-29 MED ORDER — LACTATED RINGERS IV SOLN
INTRAVENOUS | Status: DC
  Administered 2014-08-29: 15:00:00 via INTRAVENOUS

## 2014-08-29 MED ORDER — FENTANYL 2.5 MCG/ML BUPIVACAINE 1/10 % EPIDURAL INFUSION (WH - ANES)
14.0000 mL/h | INTRAMUSCULAR | Status: DC | PRN
  Administered 2014-08-29: 14 mL/h via EPIDURAL
  Administered 2014-08-29: 12 mL/h via EPIDURAL
  Administered 2014-08-30: 14 mL/h via EPIDURAL
  Filled 2014-08-29 (×2): qty 125

## 2014-08-29 MED ORDER — EPHEDRINE 5 MG/ML INJ
10.0000 mg | INTRAVENOUS | Status: DC | PRN
  Filled 2014-08-29: qty 2

## 2014-08-29 MED ORDER — ONDANSETRON HCL 4 MG/2ML IJ SOLN: 4.0000 mg | Freq: Four times a day (QID) | INTRAMUSCULAR | Status: DC | PRN

## 2014-08-29 MED ORDER — DIPHENHYDRAMINE HCL 50 MG/ML IJ SOLN: 12.5000 mg | INTRAMUSCULAR | Status: DC | PRN

## 2014-08-29 MED ORDER — PENICILLIN G POTASSIUM 5000000 UNITS IJ SOLR
5.0000 10*6.[IU] | Freq: Once | INTRAVENOUS | Status: AC
  Administered 2014-08-29: 5 10*6.[IU] via INTRAVENOUS
  Filled 2014-08-29: qty 5

## 2014-08-29 MED ORDER — NALBUPHINE HCL 10 MG/ML IJ SOLN: 5.0000 mg | INTRAMUSCULAR | Status: DC | PRN

## 2014-08-29 MED ORDER — FLEET ENEMA 7-19 GM/118ML RE ENEM: 1.0000 | ENEMA | RECTAL | Status: DC | PRN

## 2014-08-29 MED ORDER — PENICILLIN G POTASSIUM 5000000 UNITS IJ SOLR
2.5000 10*6.[IU] | INTRAMUSCULAR | Status: DC
  Administered 2014-08-29 – 2014-08-30 (×3): 2.5 10*6.[IU] via INTRAVENOUS
  Filled 2014-08-29 (×6): qty 2.5

## 2014-08-29 MED ORDER — OXYTOCIN BOLUS FROM INFUSION
500.0000 mL | INTRAVENOUS | Status: DC
  Administered 2014-08-30: 500 mL via INTRAVENOUS

## 2014-08-29 MED ORDER — ACETAMINOPHEN 325 MG PO TABS
650.0000 mg | ORAL_TABLET | ORAL | Status: DC | PRN
  Administered 2014-08-30: 650 mg via ORAL
  Filled 2014-08-29: qty 2

## 2014-08-29 MED ORDER — OXYTOCIN 40 UNITS IN LACTATED RINGERS INFUSION - SIMPLE MED
1.0000 m[IU]/min | INTRAVENOUS | Status: DC
  Administered 2014-08-29: 2 m[IU]/min via INTRAVENOUS
  Filled 2014-08-29: qty 1000

## 2014-08-29 MED ORDER — PHENYLEPHRINE 40 MCG/ML (10ML) SYRINGE FOR IV PUSH (FOR BLOOD PRESSURE SUPPORT)
80.0000 ug | PREFILLED_SYRINGE | INTRAVENOUS | Status: DC | PRN
  Filled 2014-08-29: qty 20
  Filled 2014-08-29: qty 2

## 2014-08-29 MED ORDER — OXYCODONE-ACETAMINOPHEN 5-325 MG PO TABS: 1.0000 | ORAL_TABLET | ORAL | Status: DC | PRN

## 2014-08-29 MED ORDER — OXYTOCIN 40 UNITS IN LACTATED RINGERS INFUSION - SIMPLE MED: 62.5000 mL/h | INTRAVENOUS | Status: DC

## 2014-08-29 MED ORDER — LACTATED RINGERS IV SOLN
500.0000 mL | INTRAVENOUS | Status: DC | PRN
  Administered 2014-08-29: 500 mL via INTRAVENOUS

## 2014-08-29 MED ORDER — TERBUTALINE SULFATE 1 MG/ML IJ SOLN: 0.2500 mg | Freq: Once | INTRAMUSCULAR | Status: AC | PRN

## 2014-08-29 MED ORDER — OXYCODONE-ACETAMINOPHEN 5-325 MG PO TABS: 2.0000 | ORAL_TABLET | ORAL | Status: DC | PRN

## 2014-08-29 MED ORDER — LIDOCAINE HCL (PF) 1 % IJ SOLN
30.0000 mL | INTRAMUSCULAR | Status: AC | PRN
  Administered 2014-08-30 (×2): 30 mL via SUBCUTANEOUS
  Filled 2014-08-29 (×2): qty 30

## 2014-08-29 MED ORDER — CITRIC ACID-SODIUM CITRATE 334-500 MG/5ML PO SOLN
30.0000 mL | ORAL | Status: DC | PRN
Start: 2014-08-29 — End: 2014-08-30

## 2014-08-29 NOTE — H&P (Addendum)
[name] is a 29 y.o. female G1P0 Unknown presenting for AF leak at 39+ wks.  RA:  AF leak x 11:45 am  HPI/HPP:  Normal pregnancy.  FMs pos.  No reg UC.  Clear AF x 11:45 am with Nit/Fern pos.  No PEC Sx.  GBS pos.  OB History    Gravida Para Term Preterm AB TAB SAB Ectopic Multiple Living   1 0             Past Medical History  Diagnosis Date  . Anxiety   . PONV (postoperative nausea and vomiting)   . Seasonal allergies   . Pilonidal cyst 08/2011    is open and draining  . 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 Shoulder surgery  2006    right  . Wisdom tooth extraction    . Pilonidal cyst excision  09/22/2011    Procedure: CYST EXCISION PILONIDAL EXTENSIVE;  Surgeon: Clovis Puhomas A. Cornett, MD;  Location: [facility];  Service: General;  Laterality: N/A;  . Colposcopy     Family History: family history includes Anesthesia problems in her mother; Breast cancer (age of onset: 6042) in her mother; Congenital heart disease in her father; Diabetes in her maternal grandmother and paternal grandmother; Heart attack in her maternal grandfather and paternal grandfather; Heart disease in her maternal grandfather and paternal grandfather; Heart failure in her paternal grandfather; Hypertension in her father; Lung cancer in her paternal grandfather; Osteoporosis in her maternal grandmother; Rectal cancer in her paternal grandmother. Social History:  reports that she has never smoked. She has never used smokeless tobacco. She reports that she does not drink alcohol or use illicit drugs. No current facility-administered medications for this encounter. No Known Allergies    Last menstrual period 09/28/2013. Exam Physical Exam   FHR monitoring:  Base line 140's with very good accelerations, no deceleration.  UCs irregular, mild. VE 2/80%/Vtx/-2,-1 fixed.  Rupture  completed.  HPP:  Patient Active Problem List   Diagnosis Date Noted  . H/O pilonidal cyst 10/17/2012  . Pilonidal disease 01/11/2012  . Pilonidal cyst 09/10/2011  . ACUTE FRONTAL SINUSITIS 02/24/2009  . OSTEOARTHRITIS 03/30/2007  . WARTS, MULTIPLE 01/24/2007  . HEADACHE 11/25/2006    Prenatal labs: ABO, Rh:  A neg Antibody:   neg Rubella:  Immune RPR:  NR HBsAg:  NR  HIV:  NR Genetic testing: Ultrascreen/AFP1 neg US anato: wnl, placenta wnl at 28 wks. Female. GBS:  Pos  Assessment/Plan: 39+ wks with SROM.  FHR monitoring Cat 1.  GBS pos, Pen G.  Starting Pitocin induction.  Expectant management towards probable vaginal delivery. Epidural PRN. PIH x admission.  Will do labs CBC, CMP, U. Acid.   [name],MARIE-LYNE 08/29/2014, 3:16 PM

## 2014-08-29 NOTE — Progress Notes (Signed)
Subjective: FHR monitoring, VS and labs reviewed thru patient's chart UCs reg  Anesthesia none   Objective: BP 138/93 mmHg  Pulse 106  Temp(Src) 98 F (36.7 C) (Oral)  Resp 18  Ht 5\' 9"  (1.753 m)  Wt 240 lb (108.863 kg)  BMI 35.43 kg/m2  LMP 09/28/2013  BPs 137-146/90-93  FHT:  Base line 130's with good accelerations, no deceleration. UC:  q2-3 min on pitocin VE:   Deferred (ruptured), last exam 2/80%/Vtx/-2,-1.  PIH labs wnl with AST/ALT wnl, Plts 222, U. Acid 4.6   Assessment / Plan: Entering labor on Pitocin.  Mild PIH in labor, no evidence of PEC.  Continue same management.  Fetal Wellbeing:  Cat 1. Pain Control:  Epidural PRN  Anticipated MOD:  Vaginal  Prentice Sackrider,MARIE-LYNE 08/29/2014, 8:28 PM

## 2014-08-30 ENCOUNTER — Encounter (HOSPITAL_COMMUNITY): Payer: Self-pay | Admitting: *Deleted

## 2014-08-30 LAB — RPR: RPR Ser Ql: NONREACTIVE

## 2014-08-30 MED ORDER — PRENATAL MULTIVITAMIN CH
1.0000 | ORAL_TABLET | Freq: Every day | ORAL | Status: DC
  Administered 2014-08-31: 1 via ORAL
  Filled 2014-08-30: qty 1

## 2014-08-30 MED ORDER — DIBUCAINE 1 % RE OINT: 1.0000 "application " | TOPICAL_OINTMENT | RECTAL | Status: DC | PRN

## 2014-08-30 MED ORDER — ONDANSETRON HCL 4 MG/2ML IJ SOLN: 4.0000 mg | INTRAMUSCULAR | Status: DC | PRN

## 2014-08-30 MED ORDER — SENNOSIDES-DOCUSATE SODIUM 8.6-50 MG PO TABS
2.0000 | ORAL_TABLET | ORAL | Status: DC
  Administered 2014-08-31 – 2014-09-01 (×2): 2 via ORAL
  Filled 2014-08-30 (×2): qty 2

## 2014-08-30 MED ORDER — OXYTOCIN 40 UNITS IN LACTATED RINGERS INFUSION - SIMPLE MED: 62.5000 mL/h | INTRAVENOUS | Status: DC | PRN

## 2014-08-30 MED ORDER — ONDANSETRON HCL 4 MG PO TABS: 4.0000 mg | ORAL_TABLET | ORAL | Status: DC | PRN

## 2014-08-30 MED ORDER — OXYCODONE-ACETAMINOPHEN 5-325 MG PO TABS: 1.0000 | ORAL_TABLET | ORAL | Status: DC | PRN

## 2014-08-30 MED ORDER — ZOLPIDEM TARTRATE 5 MG PO TABS: 5.0000 mg | ORAL_TABLET | Freq: Every evening | ORAL | Status: DC | PRN

## 2014-08-30 MED ORDER — WITCH HAZEL-GLYCERIN EX PADS: 1.0000 "application " | MEDICATED_PAD | CUTANEOUS | Status: DC | PRN

## 2014-08-30 MED ORDER — SIMETHICONE 80 MG PO CHEW: 80.0000 mg | CHEWABLE_TABLET | ORAL | Status: DC | PRN

## 2014-08-30 MED ORDER — BELLADONNA ALKALOIDS-OPIUM 16.2-60 MG RE SUPP
1.0000 | Freq: Two times a day (BID) | RECTAL | Status: DC
  Administered 2014-08-30 – 2014-08-31 (×4): 1 via RECTAL
  Filled 2014-08-30 (×6): qty 1

## 2014-08-30 MED ORDER — TETANUS-DIPHTH-ACELL PERTUSSIS 5-2.5-18.5 LF-MCG/0.5 IM SUSP: 0.5000 mL | Freq: Once | INTRAMUSCULAR | Status: DC

## 2014-08-30 MED ORDER — SERTRALINE HCL 100 MG PO TABS
100.0000 mg | ORAL_TABLET | Freq: Every day | ORAL | Status: DC
  Administered 2014-08-30 – 2014-08-31 (×2): 100 mg via ORAL
  Filled 2014-08-30 (×2): qty 1

## 2014-08-30 MED ORDER — DIPHENHYDRAMINE HCL 25 MG PO CAPS: 25.0000 mg | ORAL_CAPSULE | Freq: Four times a day (QID) | ORAL | Status: DC | PRN

## 2014-08-30 MED ORDER — LANOLIN HYDROUS EX OINT: TOPICAL_OINTMENT | CUTANEOUS | Status: DC | PRN

## 2014-08-30 MED ORDER — BENZOCAINE-MENTHOL 20-0.5 % EX AERO
1.0000 "application " | INHALATION_SPRAY | CUTANEOUS | Status: DC | PRN
  Administered 2014-08-30 – 2014-09-01 (×2): 1 via TOPICAL
  Filled 2014-08-30 (×2): qty 56

## 2014-08-30 MED ORDER — OXYCODONE-ACETAMINOPHEN 5-325 MG PO TABS: 2.0000 | ORAL_TABLET | ORAL | Status: DC | PRN

## 2014-08-30 MED ORDER — BUPIVACAINE HCL (PF) 0.25 % IJ SOLN
INTRAMUSCULAR | Status: DC | PRN
  Administered 2014-08-29 (×2): 4 mL

## 2014-08-30 MED ORDER — IBUPROFEN 600 MG PO TABS
600.0000 mg | ORAL_TABLET | Freq: Four times a day (QID) | ORAL | Status: DC
  Administered 2014-08-30 – 2014-09-01 (×8): 600 mg via ORAL
  Filled 2014-08-30 (×8): qty 1

## 2014-08-30 MED ORDER — ACETAMINOPHEN 325 MG PO TABS: 650.0000 mg | ORAL_TABLET | ORAL | Status: DC | PRN

## 2014-08-30 MED ORDER — LIDOCAINE-EPINEPHRINE (PF) 2 %-1:200000 IJ SOLN
INTRAMUSCULAR | Status: DC | PRN
  Administered 2014-08-29: 3 mL

## 2014-08-30 NOTE — Anesthesia Procedure Notes (Signed)
Epidural Patient location during procedure: OB  Staffing Anesthesiologist: Maurico Perrell, CHRIS Performed by: anesthesiologist   Preanesthetic Checklist Completed: patient identified, surgical consent, pre-op evaluation, timeout performed, IV checked, risks and benefits discussed and monitors and equipment checked  Epidural Patient position: sitting Prep: site prepped and draped and DuraPrep Patient monitoring: heart rate, cardiac monitor, continuous pulse ox and blood pressure Approach: midline Location: L3-L4 Injection technique: LOR saline  Needle:  Needle type: Tuohy  Needle gauge: 17 G Needle length: 9 cm Needle insertion depth: 7 cm Catheter type: closed end flexible Catheter size: 19 Gauge Catheter at skin depth: 13 cm Test dose: negative and 2% lidocaine with Epi 1:200 K  Assessment Events: blood not aspirated, injection not painful, no injection resistance, negative IV test and no paresthesia  Additional Notes H+P and labs checked, risks and benefits discussed with the patient, consent obtained, procedure tolerated well and without complications.  Reason for block:procedure for pain   

## 2014-08-30 NOTE — Anesthesia Postprocedure Evaluation (Signed)
  Anesthesia Post-op Note  Patient: [name]  Procedure(s) Performed: * No procedures listed *  Patient Location: Mother/Baby  Anesthesia Type:Epidural  Level of Consciousness: awake, alert  and oriented  Airway and Oxygen Therapy: Patient Spontanous Breathing  Post-op Pain: none  Post-op Assessment: Post-op Vital signs reviewed              Post-op Vital Signs: Reviewed and stable  Last Vitals:  Filed Vitals:   08/30/14 0940  BP: 114/69  Pulse: 116  Temp: 37 C  Resp: 20    Complications: No apparent anesthesia complications

## 2014-08-30 NOTE — Progress Notes (Signed)
MOB was referred for history of depression/anxiety.  Referral is screened out by Clinical Social Worker because none of the following criteria appear to apply: -History of anxiety/depression during this pregnancy, or of post-partum depression. - Diagnosis of anxiety and/or depression within last 3 years or -MOB's symptoms are currently being treated with medication and/or therapy. MOB is currently prescribed Zoloft. Per OB records, symptoms well controlled with medication.  Please contact the Clinical Social Worker if needs arise or upon MOB request.   Amilliana Hayworth, LCSW [phone] 

## 2014-08-30 NOTE — Anesthesia Preprocedure Evaluation (Signed)
Anesthesia Evaluation  Patient identified by MRN, date of birth, ID band Patient awake    Reviewed: Allergy & Precautions, Patient's Chart, lab work & pertinent test results  History of Anesthesia Complications (+) PONV and history of anesthetic complications  Airway Mallampati: III  TM Distance: >3 FB Neck ROM: Full    Dental  (+) Teeth Intact   Pulmonary neg pulmonary ROS,  breath sounds clear to auscultation        Cardiovascular negative cardio ROS  Rhythm:Regular     Neuro/Psych  Headaches, negative psych ROS   GI/Hepatic negative GI ROS, Neg liver ROS,   Endo/Other  negative endocrine ROS  Renal/GU negative Renal ROS     Musculoskeletal   Abdominal   Peds  Hematology negative hematology ROS (+)   Anesthesia Other Findings   Reproductive/Obstetrics (+) Pregnancy                             Anesthesia Physical Anesthesia Plan  ASA: II  Anesthesia Plan: Epidural   Post-op Pain Management:    Induction:   Airway Management Planned:   Additional Equipment:   Intra-op Plan:   Post-operative Plan:   Informed Consent: I have reviewed the patients History and Physical, chart, labs and discussed the procedure including the risks, benefits and alternatives for the proposed anesthesia with the patient or authorized representative who has indicated his/her understanding and acceptance.   Dental advisory given  Plan Discussed with: Anesthesiologist  Anesthesia Plan Comments:         Anesthesia Quick Evaluation

## 2014-08-30 NOTE — Lactation Note (Signed)
This note was copied from the chart of [name] Elyshia Anschutz. Lactation Consultation Note  P1, This is FOB 3rd child. Baby sleeping STS on mother's chest. Reviewed hand expression with mother.  Gave baby a drop of colostrum from spoon hoping to interest her in bf. Assisted in placing baby in football hold.  Mother seems to like this position but baby very sleepy. Suggest mother hand express and prepump w/ hand pump before latching. Mother's left nipple has small crack from 25 min feeding earlier.  Suggest she apply ebm. Reviewed the importance of a deep latch.  Suggest waiting until baby opens wide. Discussed Baby & Me booklet pp 20-24 and cluster feeding. Encouraged her to call if she needs assistance w/feeding. Mom encouraged to feed baby 8-12 times/24 hours and with feeding cues.  Mom made aware of O/P services, breastfeeding support groups, community resources, and our phone # for post-discharge questions.      Patient Name: [name] Today's Date: 08/30/2014 Reason for consult: Initial assessment   Maternal Data Has patient been taught Hand Expression?: Yes Does the patient have breastfeeding experience prior to this delivery?: No  Feeding    LATCH Score/Interventions                      Lactation Tools Discussed/Used     Consult Status Consult Status: Follow-up Date: 08/31/14 Follow-up type: In-patient    Dahlia ByesBerkelhammer, Jamarl Pew [facility] 08/30/2014, 5:53 PM

## 2014-08-30 NOTE — Progress Notes (Signed)
Patient ID: [name], female   DOB: 1986-01-14, 29 y.o.   MRN: [id number] INTERVAL NOTE: PPD #0 S/P SVD with 2nd degree laceration  S:   Sitting in bed, skin-to-skin with infant, min cramping, (+) voids, small bleed, denies HA/NV/dizziness  O:   VSS, AAO x 3, NAD  U-1  Scant lochia  Moderate edema of vulva - 2nd degree repair healing well - ice pack in place  A / P:   PPD #0  Anxiety  Rh Neg - infant blood type pending  Hemorrhoid  Start B&O BID  Continue Zoloft 100 mg qhs  Stable post partum  Routine PP orders  Kenard GowerAWSON, Alaysha Jefcoat, M, MSN, CNM 08/30/2014, 9:26 AM

## 2014-08-31 LAB — CBC
HEMATOCRIT: 26.3 % — AB (ref 36.0–46.0)
Hemoglobin: 8.3 g/dL — ABNORMAL LOW (ref 12.0–15.0)
MCH: 24.7 pg — ABNORMAL LOW (ref 26.0–34.0)
MCHC: 31.6 g/dL (ref 30.0–36.0)
MCV: 78.3 fL (ref 78.0–100.0)
Platelets: 200 10*3/uL (ref 150–400)
RBC: 3.36 MIL/uL — ABNORMAL LOW (ref 3.87–5.11)
RDW: 14.9 % (ref 11.5–15.5)
WBC: 19.5 10*3/uL — ABNORMAL HIGH (ref 4.0–10.5)

## 2014-08-31 MED ORDER — RHO D IMMUNE GLOBULIN 1500 UNIT/2ML IJ SOSY
300.0000 ug | PREFILLED_SYRINGE | Freq: Once | INTRAMUSCULAR | Status: AC
  Administered 2014-08-31: 300 ug via INTRAMUSCULAR
  Filled 2014-08-31: qty 2

## 2014-08-31 NOTE — Progress Notes (Signed)
Post Partum Day 1 S/P spontaneous vaginal  Feeding: breast Subjective: No HA, SOB, CP, F/C, breast symptoms. Normal vaginal bleeding, no clots. Pain controlled with po meds. ambulating without symptoms. voiding without difficulty . Some perineal discomfort from edema and tearing. Objective: BP 133/82 mmHg  Pulse 89  Temp(Src) 97.6 F (36.4 C) (Oral)  Resp 18  Ht 5\' 9"  (1.753 m)  Wt 108.863 kg (240 lb)  BMI 35.43 kg/m2  SpO2 99%  LMP 09/28/2013  Breastfeeding? Unknown I&O reviewed.   Physical Exam:  General: alert, cooperative and no distress Lochia: appropriate Uterine Fundus: firm DVT Evaluation: No evidence of DVT seen on physical exam. Ext: No c/c/e  Recent Labs  08/29/14 1515 08/31/14 0505  HGB 10.5* 8.3*  HCT 32.7* 26.3*      Assessment/Plan: 29 y.o.  PPD #1 .  normal postpartum exam Continue current postpartum care Ambulate   LOS: 2 days   Ambur Province A. 08/31/2014 9:18 AM

## 2014-08-31 NOTE — Plan of Care (Signed)
Problem: Consults Goal: Skin Care Protocol Initiated - if Braden Score 18 or less If consults are not indicated, leave blank or document N/A  Outcome: Not Applicable Date Met:  49/86/51 Braden scale 22.

## 2014-08-31 NOTE — Lactation Note (Signed)
This note was copied from the chart of [name] Delicia Pantaleo. Lactation Consultation Note Mom states BF going well, nipples getting sore from cluster feeding. Had supplemented some d/t sore nipples and mom tired. Breast are filling. Encouraged not to supplement d/t filling, discussed engorgement and frequent BF. Discussed proper deep latching to prevent soreness. Mom had small swelling under axillary area, gave ICE to apply for 20 min. At intervals.  Encouraged to massage downwards towards breast. Mom has good everted nipples, requested comfort gels. Reminded of support groups, I&O, supply and demand. Patient Name: [name] ZOXWR'UToday's Date: 08/31/2014 Reason for consult: Follow-up assessment;Breast/nipple pain   Maternal Data    Feeding Feeding Type: Breast Fed Length of feed: 15 min  LATCH Score/Interventions       Type of Nipple: Everted at rest and after stimulation  Comfort (Breast/Nipple): Filling, red/small blisters or bruises, mild/mod discomfort  Problem noted: Mild/Moderate discomfort;Filling Interventions (Filling): Massage;Frequent nursing Interventions (Mild/moderate discomfort): Comfort gels;Hand expression;Hand massage  Intervention(s): Skin to skin;Position options;Support Pillows     Lactation Tools Discussed/Used Tools: Comfort gels   Consult Status Consult Status: Complete    [name] G 08/31/2014, 2:33 PM

## 2014-09-01 ENCOUNTER — Ambulatory Visit: Payer: Self-pay

## 2014-09-01 LAB — RH IG WORKUP (INCLUDES ABO/RH)
ABO/RH(D): A NEG
FETAL SCREEN: NEGATIVE
Gestational Age(Wks): 39.6
Unit division: 0

## 2014-09-01 MED ORDER — POLYSACCHARIDE IRON COMPLEX 150 MG PO CAPS: 150.0000 mg | ORAL_CAPSULE | Freq: Every day | ORAL | Status: DC

## 2014-09-01 MED ORDER — IBUPROFEN 800 MG PO TABS: 800.0000 mg | ORAL_TABLET | Freq: Three times a day (TID) | ORAL | Status: DC

## 2014-09-01 MED ORDER — MAGNESIUM OXIDE 400 (241.3 MG) MG PO TABS
400.0000 mg | ORAL_TABLET | Freq: Every day | ORAL | Status: DC
  Filled 2014-09-01: qty 1

## 2014-09-01 MED ORDER — HYDROCORTISONE ACE-PRAMOXINE 2.5-1 % EX CREA: 1.0000 "application " | TOPICAL_CREAM | Freq: Four times a day (QID) | CUTANEOUS | Status: DC | PRN

## 2014-09-01 MED ORDER — MAGNESIUM OXIDE 400 (241.3 MG) MG PO TABS: 400.0000 mg | ORAL_TABLET | Freq: Every day | ORAL | Status: DC

## 2014-09-01 MED ORDER — DOCUSATE SODIUM 100 MG PO CAPS: 200.0000 mg | ORAL_CAPSULE | Freq: Two times a day (BID) | ORAL | Status: DC

## 2014-09-01 MED ORDER — HYDROCORTISONE ACETATE 25 MG RE SUPP: 25.0000 mg | Freq: Two times a day (BID) | RECTAL | Status: DC

## 2014-09-01 MED ORDER — OXYCODONE-ACETAMINOPHEN 5-325 MG PO TABS: 1.0000 | ORAL_TABLET | ORAL | Status: DC | PRN

## 2014-09-01 NOTE — Progress Notes (Signed)
PPD 2 SVD  S:  Reports feeling tired / tearful due to long difficult night with nipple pain trying to BF             Tolerating po/ No nausea or vomiting             Bleeding is light             Pain controlled with motrin and percocet             Up ad lib / ambulatory / voiding QS  Newborn breast-feeding   O:               VS: BP 123/83 mmHg  Pulse 75  Temp(Src) 97.8 F (36.6 C) (Oral)  Resp 18  Ht 5\' 9"  (1.753 m)  Wt 108.863 kg (240 lb)  BMI 35.43 kg/m2  SpO2 99%  LMP 09/28/2013  Breastfeeding? Unknown   LABS:              Recent Labs  08/29/14 1515 08/31/14 0505  WBC 10.7* 19.5*  HGB 10.5* 8.3*  PLT 222 200               Blood type: --/--/A NEG (07/09 0505)  Rubella: Immune (12/08 0000)                           Physical Exam:             Alert and oriented X3            Abdomen: soft, non-tender, non-distended              Breasts filling / nipples bruised and excoriated             Fundus: firm, non-tender, U-1  Perineum: no edema  Lochia: light   Extremities: trace edema, no calf pain or tenderness    A: PPD # 2              ABL anemia - fatigue             Nipple trauma  Doing well - stable status  P: Routine post partum orders  Iron and colace and magnesium for DC with hemorrhoid management             Comfort gels / recommend shields for intermittent use next 2-3 days to allow nipples to heal / warm tea bags to nipple for comfort followed by coconut oil until healed             Lactation visit prior to DC today - call office if persistent concerns with BF  Marlinda MikeBAILEY, Harinder Romas CNM, MSN, [facility] 09/01/2014, 10:06 AM

## 2014-09-01 NOTE — Discharge Instructions (Signed)
°  Constipation and hemorrhoid management:  WATER 4-6 BOTTLES every day Colace  twice daily - soften stools / may decrease dose to once daily if stools soft or increase to three times daily if hard stools Magnesium  DAILY prevention If no BM in 3 days - take dose MIRALAX  Apply external cream to hemorrhoids 2-4 times daily until not painful or swollen Insert rectal suppository nightly at bedtime until pain resolved  Tub soaks before bedtime

## 2014-09-01 NOTE — Discharge Summary (Signed)
Obstetric Discharge Summary  Reason for Admission: onset of labor Prenatal Procedures: none Intrapartum Procedures: spontaneous vaginal delivery, GBS prophylaxis and epidural Postpartum Procedures: none Complications-Operative and Postpartum: 2nd degree perineal laceration HEMOGLOBIN  Date Value Ref Range Status  08/31/2014 8.3* 12.0 - 15.0 g/dL Final    Comment:    DELTA CHECK NOTED REPEATED TO VERIFY    HCT  Date Value Ref Range Status  08/31/2014 26.3* 36.0 - 46.0 % Final    Physical Exam:  General: alert, cooperative and no distress Lochia: appropriate Uterine Fundus: firm Incision: healing well DVT Evaluation: No evidence of DVT seen on physical exam.  Discharge Diagnoses: Term Pregnancy-delivered / ABL anemia / hemorrhoids  Discharge Information: Date: 09/01/2014 Activity: pelvic rest Diet: routine Medications: PNV, Ibuprofen, Colace, Iron, Percocet and magnesium and zoloft and zantac and procotocream and Anusol suppository Condition: stable Instructions: refer to practice specific booklet Discharge to: home Follow-up Information    [facility]. Schedule an appointment as soon as possible for a visit in 6 weeks.   Specialty:  Obstetrics and Gynecology   Contact information:   [address] [place] [id number] [phone]       Newborn Data: Live born female  Birth Weight: 8 lb 5.7 oz (3790 g) APGAR: 8, 9  Home with mother.  [name], Makari Portman 09/01/2014, 10:23 AM

## 2014-09-01 NOTE — Lactation Note (Addendum)
This note was copied from the chart of [name]. Lactation Consultation Note  Patient Name: [name] Today's Date: 09/01/2014 Reason for consult: Follow-up assessment;Pump rental;Breast/nipple pain  2nd visit this am . LC had asked mom and dad to call with feeding cues for feeding  Assessment with a Nipple Shield. They did not call and fed finger fed the baby at 1030 am 30 ml. Presently the baby sound asleep in crib. LC unable to assess latch with NS.  LC reviewed basics , prevention and tx of sore nipples and engorgement prevention and tx . Due to mom already being sore both nipples explained in previous LC note , gave mom a 2nd set comfort gels  Due dad throwing them out due to falling on the floor, shells , already has hand pump, and DEBP for the rental. Instructed on the use of the pump . Fitted mom for the nipple shield , ( #20 and #24 ) , #24 being the best fit.  Showed mom how to apply it and use EBM or formula in the top as an appetizer.  LC stressed to mom and dad if they are unable to latch due to discomfort , to continue finger feeding and consistent  Pumping both breast to establish and protect milk supply. Stressed to mom whet ever milk she pumps off to safe and use in NS or finger feed.  Both mom and dad receptive [facility] Friday July 15 th at 9 am  , apt reminder sheet given to mom. Mother informed of post-discharge support and given phone number to the lactation department, including services for phone  call assistance; out-patient appointments; and breastfeeding support group. List of other breastfeeding resources in the community  given in the handout. Encouraged mother to call for problems or concerns related to breastfeeding.   Maternal Data Has patient been taught Hand Expression?: Yes  Feeding Feeding Type:  (per dad baby last fed at 1030 , finger fed , mom to sore to latch )  LC unable to assess latch at the breast, baby sound  asleep and not showing signs of hunger.   LATCH Score/Interventions                Intervention(s): Breastfeeding basics reviewed (see LC note )     Lactation Tools Discussed/Used Tools: Nipple Dorris CarnesShields;Shells;Pump;Comfort gels Nipple shield size: 20;24;Other (comment) (sized mom for NS , #24 better fit . ) Shell Type: Inverted Breast pump type: Other (comment) (also has a hand pump . rented a DEBP with instruction ) WIC Program: No Pump Review: Setup, frequency, and cleaning;Milk Storage ([facility]p [facility] ) Initiated by:: MAI  Date initiated:: 09/01/14   Consult Status Consult Status: Follow-up Date: 09/06/14 [facility] reminder given to mom ) Follow-up type: Out-patient    [name], [name] 09/01/2014, 12:43 PM

## 2014-09-01 NOTE — Lactation Note (Signed)
This note was copied from the chart of [name]. Lactation Consultation Note  Patient Name: [name] Mady GemmaMeagan Ost Today's Date: 09/01/2014 Reason for consult: Follow-up assessment;Infant weight loss;Breast/nipple pain (6% weight loss , sore nipples L > Right )  Per mom has been to sore to latch the last 4 feedings. Has been finger fed with a syringe. LC assessed breast tissue with mom permission , right - positional strip , left , small scab.  LC discussed several options keeping in mind establishing milk supply and protecting level. Per mom still wants to breast feed . Mom to page for feeding assessment    Maternal Data Has patient been taught Hand Expression?: Yes  Feeding Feeding Type:  (recently finger fed )  LATCH Score/Interventions                Intervention(s): Breastfeeding basics reviewed     Lactation Tools Discussed/Used     Consult Status Consult Status: Follow-up Date: 09/01/14 Follow-up type: In-patient    Kathrin Greathouseorio, Satia Winger Ann 09/01/2014, 9:42 AM

## 2014-09-06 ENCOUNTER — Ambulatory Visit (HOSPITAL_COMMUNITY)
Admit: 2014-09-06 | Discharge: 2014-09-06 | Disposition: A | Discharge status: Home / Self Care | Payer: BC Managed Care – PPO | Attending: Obstetrics & Gynecology | Admitting: Obstetrics & Gynecology

## 2014-09-06 NOTE — Lactation Note (Signed)
Lactation Consult Baby's Name: [name] Date of Birth: 08/30/2015 Pediatrician: Georga Kaufmann Gender: female Gestational Age: [redacted]w[redacted]d (At Birth) Birth Weight: 8 lb 5.7 oz (3790 g) Weight at Discharge: Weight: 7 lb 13.9 oz (3570 g)Date of Discharge: 09/01/2014 Filed Weights   08/30/14 0454 08/31/14 0030 08/31/14 2338  Weight: 8 lb 5.7 oz (3790 g) 8 lb 0.8 oz (3650 g) 7 lb 13.9 oz (3570 g)      Mother's reason for visit:  Help with breast feeding Visit Type:  Assist with latch Appointment Notes:  Mom has very sore nipples, has not put the baby to the breast since Saturday Consult:  Initial Lactation Consultant:  Audry Riles D  ________________________________________________________________________    ________________________________________________________________________  Mother's Name: Annamarie Major Hehir Type of delivery:   Breastfeeding Experience:  P1  ________________________________________________________________________  Breastfeeding History (Post Discharge)  Frequency of breastfeeding:  Has not been putting the baby to the breast   Supplementation  Formula:  Volume 60-120 ml Frequency:  q feeding         Breastmilk:  Volume 60 ml Frequency:  This morning  Method:  Bottle,   Pumping  Type of pump:  Medela pump in style Frequency:  3 times/day Volume:  60 ml  Infant Intake and Output Assessment  Voids:  QS in 24 hrs.  Color:  Clear yellow Stools:  QS in 24 hrs.  Color:  Yellow had yellow stool while here for appointment  ________________________________________________________________________  Maternal Breast Assessment  Breast:  Soft Nipple:  Erect and Reddened  _______________________________________________________________________ Feeding Assessment/Evaluation  Initial feeding assessment:  Infant's oral assessment:  WNL  Positioning:  Cradle Left breast  LATCH documentation:  Latch:  2 = Grasps  breast easily, tongue down, lips flanged, rhythmical sucking.  Audible swallowing:  1 = A few with stimulation  Type of nipple:  2 = Everted at rest and after stimulation  Comfort (Breast/Nipple):  1 = Filling, red/small blisters or bruises, mild/mod discomfort  Hold (Positioning):  1 = Assistance needed to correctly position infant at breast and maintain latch  LATCH score:  7  Attached assessment:  Deep  Lips flanged:  Yes.    Lips untucked:  Yes.    Suck assessment:  Displays both   Pre-feed weight:  3870 g  (8 lb. 8.5 oz.) Post-feed weight:  3880 g (8 lb. 8.8 oz.) Amount transferred:  10 ml Amount supplemented:  45 ml baby was fed before coming to appointment      Total amount transferred:  10 ml Total supplement given:  45 ml  Mom reports much pain with nursing while in hospital. Nipples very sore and cracked. Mom has been pumping and bottle feeding EBM and formula since Saturday.Was letting her nipples heal- to painful to latch baby. Assisted mom in cradle position, Baby had been fed before coming to appointment and was not very hungry. Did latch well to breast. Mom needed much instruction on positioning baby and breast- encouraged to hold breast throughout the feeding. Mom reports some pain with initial latch but eases off after a minute. Mom reports she can tell the difference when baby is on right now. Suggested trying football hold some times to aid in healing. Assisted mom with football hold on right breast. Baby only took a few sucks but mom reports no pain and feels comfortable with getting her latched in football hold. Teaching reviewed with parents. Encouragement given. Encouraged to try to latch the baby at every feeding- if she is  too frantic give some from bottle to calm then latch. May still need to supplement after nursing. Discussed supply and demand- importance of frequent nursing or pumping to promote a good milk supply. No questions at present To call prn

## 2015-05-16 ENCOUNTER — Telehealth: Payer: Self-pay

## 2015-05-16 NOTE — Telephone Encounter (Signed)
The pt has been scheduled with Dr Myrtie Neitheranis on 06/19/15 8:45 am.  Pt notified via phone and My Chart

## 2015-05-16 NOTE — Telephone Encounter (Signed)
-----   Message from Rachael Feeaniel P Jacobs, MD sent at 05/15/2015  4:53 PM EDT ----- [name] LabradorWe'll get her in.  Thanks  [name], She needs first available appt with any provider.  Referred by Dr. Maisie Fushomas. Perianal disease, suspicious for Crohn's disease.  If nobody has any spots in next 3-4 weeks, please double book with me. Thanks  dj    ----- Message -----    From: Romie LeveeAlicia Thomas, MD    Sent: 05/15/2015  11:29 AM      To: Rachael Feeaniel P Jacobs, MD  Here is the name of that patient who needs to be seen for suspected Crohn's.   Thanks  BulgariaALicia

## 2015-06-19 ENCOUNTER — Ambulatory Visit (INDEPENDENT_AMBULATORY_CARE_PROVIDER_SITE_OTHER): Payer: BC Managed Care – PPO | Admitting: Gastroenterology

## 2015-06-19 ENCOUNTER — Encounter: Payer: Self-pay | Admitting: Gastroenterology

## 2015-06-19 VITALS — BP 110/70 | HR 78 | Ht 69.0 in | Wt 222.6 lb

## 2015-06-19 DIAGNOSIS — K603 Anal fistula: Secondary | ICD-10-CM

## 2015-06-19 NOTE — Progress Notes (Signed)
[facility] Consult Note:  History: [name] 06/19/2015  Referring physician: Romie LeveeAlicia Thomas, MD  Reason for consult/chief complaint: Crohn's Disease   Subjective HPI:  [name] was referred to me by Dr. Maisie Fushomas of surgery regarding possible Crohn's disease. [name] reports about 2 years ago, prior to pregnancy, she developed a pilonidal cyst. This was apparently treated surgically but she had great difficulty healing afterwards. During her pregnancy in 2015 she started having persistent perianal pain with discharge. She saw Dr. Maisie Fushomas about 2 months ago, who was concerned that she had a fissure or possible perianal fistula that might be consistent with Crohn's. A short course of prednisone apparently gave some improvement. [name] denies abdominal pain or diarrhea. She says that her bowel movements are sometimes large and firm with the need to strain. She denies painful eye redness skin rash or joint swelling. ROS:  Review of Systems  Constitutional: Negative for appetite change and unexpected weight change.  HENT: Negative for mouth sores and voice change.   Eyes: Negative for pain and redness.  Respiratory: Negative for cough and shortness of breath.   Cardiovascular: Negative for chest pain and palpitations.  Genitourinary: Negative for dysuria and hematuria.  Musculoskeletal: Negative for myalgias and arthralgias.  Skin: Negative for pallor and rash.  Neurological: Negative for weakness and headaches.  Hematological: Negative for adenopathy.     Past Medical History: Past Medical History  Diagnosis Date  . Anxiety   . PONV (postoperative nausea and vomiting)   . Seasonal allergies   . Pilonidal cyst 08/2011    is open and draining  . 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 Postpartum care following vaginal delivery 08/30/2014  . Postpartum care following vaginal delivery (7/8) 08/30/2014     Past Surgical  History: Past Surgical History  Procedure Laterality Date  . Shoulder surgery  2006    right  . Wisdom tooth extraction    . Pilonidal cyst excision  09/22/2011    Procedure: CYST EXCISION PILONIDAL EXTENSIVE;  Surgeon: Clovis Puhomas A. Cornett, MD;  Location: [facility];  Service: General;  Laterality: N/A;  . Colposcopy       Family History: Family History  Problem Relation Age of Onset  . Anesthesia problems Mother     post-op N/V  . Breast cancer Mother [id number]  . Hypertension Father   . Congenital heart disease Father   . Diabetes Maternal Grandmother   . Osteoporosis Maternal Grandmother   . Heart disease Maternal Grandfather   . Heart attack Maternal Grandfather   . Diabetes Paternal Grandmother   . Rectal cancer Paternal Grandmother   . Heart disease Paternal Grandfather   . Lung cancer Paternal Grandfather   . Heart failure Paternal Grandfather   . Heart attack Paternal Grandfather     Social History: Social History   Social History  . Marital Status: Married    Spouse Name: Engaged  . Number of Children: 0  . Years of Education: N/A   Occupational History  . Special Ed Teacher [employer]   Social History Main Topics  . Smoking status: Never Smoker   . Smokeless tobacco: Never Used  . Alcohol Use: No  . Drug Use: No  . Sexual Activity: Yes    Birth Control/ Protection: None   Other Topics Concern  . None   Social History Narrative    Allergies: No Known Allergies  Outpatient Meds: Current Outpatient Prescriptions  Medication Sig Dispense Refill  . cetirizine (ZYRTEC) 10 MG tablet Take 10 mg by mouth at bedtime.    . hydrocortisone (ANUSOL-HC) 25 MG suppository Place 1 suppository (25 mg total) rectally 2 (two) times daily. 12 suppository 0  . sertraline (ZOLOFT) 100 MG tablet Take 100 mg by mouth at bedtime.      No current facility-administered medications for this visit.       ___________________________________________________________________ Objective  Exam:  BP 110/70 mmHg  Pulse 78  Ht  (1.753 m)  Wt 222 lb 9.6 oz (100.971 kg)  BMI 32.86 kg/m2   General: this is a(n) well-appearing young woman with good muscle mass   Eyes: sclera anicteric, no redness  ENT: oral mucosa moist without lesions, no cervical or supraclavicular lymphadenopathy, good dentition  CV: RRR without murmur, S1/S2, no JVD, no peripheral edema  Resp: clear to auscultation bilaterally, normal RR and effort noted  GI: soft, no tenderness, with active bowel sounds. No guarding or palpable organomegaly noted.  Skin; warm and dry, no rash or jaundice noted  Neuro: awake, alert and oriented x 3. Normal gross motor function and fluent speech Perianal exam chaperoned by our MA Robin: Posterior Perianal chronic ulcerated area.  - probable fistula, no drainage. She also has several areas of soft tissue fullness in bilateral buttocks with apparent scarring.  Data No cross-sectional abdominal imaging reports were found  Assessment: Encounter Diagnosis  Name Primary?  . Perianal fistula Yes    Suspicious for Crohn's disease, despite lack of abdominal pain or diarrhea.  Plan:  Colonoscopy. If the colon and terminal ileum looked normal, most likely CT or MR enterography to follow. Thank you for the courtesy of this consult.  Please call me with any questions or concerns.  Charlie Pitter III

## 2015-06-19 NOTE — Patient Instructions (Addendum)
You have been scheduled for a colonoscopy. Please follow written instructions given to you at your visit today.  Please pick up your prep supplies at the pharmacy within the next 1-3 days. If you use inhalers (even only as needed), please bring them with you on the day of your procedure. Your physician has requested that you go to www.startemmi.com and enter the access code given to you at your visit today. This web site gives a general overview about your procedure. However, you should still follow specific instructions given to you by our office regarding your preparation for the procedure.  If you are age 31 or older, your body mass index should be between 23-30. Your Body mass index is 32.86 kg/(m^2). If this is out of the aforementioned range listed, please consider follow up with your Primary Care Provider.  If you are age 31 or younger, your body mass index should be between 19-25. Your Body mass index is 32.86 kg/(m^2). If this is out of the aformentioned range listed, please consider follow up with your Primary Care Provider.    Thank you for choosing [facility]  Dr Amada JupiterHenry Danis III

## 2015-06-26 ENCOUNTER — Ambulatory Visit (AMBULATORY_SURGERY_CENTER): Payer: BC Managed Care – PPO | Admitting: Gastroenterology

## 2015-06-26 ENCOUNTER — Encounter: Payer: Self-pay | Admitting: Gastroenterology

## 2015-06-26 VITALS — BP 117/75 | HR 93 | Temp 98.2°F | Resp 18 | Ht 69.0 in | Wt 222.0 lb

## 2015-06-26 DIAGNOSIS — K603 Anal fistula: Secondary | ICD-10-CM

## 2015-06-26 MED ORDER — SODIUM CHLORIDE 0.9 % IV SOLN: 500.0000 mL | INTRAVENOUS | Status: DC

## 2015-06-26 NOTE — Progress Notes (Signed)
A/ox3 pleased with MAC, report to Jane RN 

## 2015-06-26 NOTE — Op Note (Signed)
[facility] Patient Name: [name] Procedure Date: 06/26/2015 2:54 PM MRN: [id number] Endoscopist: Sherilyn Cooter L. Myrtie Neither , MD Age: 30 Date of Birth: [birth year] Gender: Female Procedure:                Colonoscopy Indications:              Peri-anal fistula (apparent) Medicines:                Monitored Anesthesia Care Procedure:                Pre-Anesthesia Assessment:                           - Prior to the procedure, a History and Physical                            was performed, and patient medications and                            allergies were reviewed. The patient's tolerance of                            previous anesthesia was also reviewed. The risks                            and benefits of the procedure and the sedation                            options and risks were discussed with the patient.                            All questions were answered, and informed consent                            was obtained. Prior Anticoagulants: The patient has                            taken no previous anticoagulant or antiplatelet                            agents. ASA Grade Assessment: II - A patient with                            mild systemic disease. After reviewing the risks                            and benefits, the patient was deemed in                            satisfactory condition to undergo the procedure.                           After obtaining informed consent, the colonoscope  was passed under direct vision. Throughout the                            procedure, the patient's blood pressure, pulse, and                            oxygen saturations were monitored continuously. The                            Model CF-HQ190L [id number]) scope was introduced                            through the anus and advanced to the 10 cm into the                            ileum. The colonoscopy was performed without        difficulty. The patient tolerated the procedure                            well. The quality of the bowel preparation was                            excellent. The terminal ileum, ileocecal valve,                            appendiceal orifice, and rectum were photographed.                            The bowel preparation used was Miralax. Scope In: 3:05:03 PM Scope Out: 3:17:30 PM Scope Withdrawal Time: 0 hours 7 minutes 48 seconds  Total Procedure Duration: 0 hours 12 minutes 27 seconds  Findings:                 The perianal exam findings include perianal fistula.                           The terminal ileum appeared normal.                           The entire examined colon appeared normal on direct                            and retroflexion views. Complications:            No immediate complications. Estimated Blood Loss:     Estimated blood loss: none. Impression:               - Perianal fistula found on perianal exam.                           - The examined portion of the ileum was normal.                           - The entire examined colon is normal on direct and  retroflexion views.                           - No specimens collected. Recommendation:           - Patient has a contact number available for                            emergencies. The signs and symptoms of potential                            delayed complications were discussed with the                            patient. Return to normal activities tomorrow.                            Written discharge instructions were provided to the                            patient.                           - Resume previous diet.                           - Continue present medications.                           - No recommendation at this time regarding repeat                            colonoscopy due to no evidence of mucosal or other                            abnormalities on  today's exam.                           - After further discussion with surgical                            consultant, a decision will be made about abdominal                            imaging and lab work to see if Crohn's disease is                            present elsewhere. Jeniah Kishi L. Myrtie Neitheranis, MD 06/26/2015 3:29:07 PM This report has been signed electronically.

## 2015-06-26 NOTE — Patient Instructions (Signed)
YOU HAD AN ENDOSCOPIC PROCEDURE TODAY AT THE [facility]:   Refer to the procedure report that was given to you for any specific questions about what was found during the examination.  If the procedure report does not answer your questions, please call your gastroenterologist to clarify.  If you requested that your care partner not be given the details of your procedure findings, then the procedure report has been included in a sealed envelope for you to review at your convenience later.  YOU SHOULD EXPECT: Some feelings of bloating in the abdomen. Passage of more gas than usual.  Walking can help get rid of the air that was put into your GI tract during the procedure and reduce the bloating. If you had a lower endoscopy (such as a colonoscopy or flexible sigmoidoscopy) you may notice spotting of blood in your stool or on the toilet paper. If you underwent a bowel prep for your procedure, you may not have a normal bowel movement for a few days.  Please Note:  You might notice some irritation and congestion in your nose or some drainage.  This is from the oxygen used during your procedure.  There is no need for concern and it should clear up in a day or so.  SYMPTOMS TO REPORT IMMEDIATELY:   Following lower endoscopy (colonoscopy or flexible sigmoidoscopy):  Excessive amounts of blood in the stool  Significant tenderness or worsening of abdominal pains  Swelling of the abdomen that is new, acute  Fever of 100F or higher    For urgent or emergent issues, a gastroenterologist can be reached at any hour by calling [phone].   DIET: Your first meal following the procedure should be a small meal and then it is ok to progress to your normal diet. Heavy or fried foods are harder to digest and may make you feel nauseous or bloated.  Likewise, meals heavy in dairy and vegetables can increase bloating.  Drink plenty of fluids but you should avoid alcoholic beverages for 24  hours.  ACTIVITY:  You should plan to take it easy for the rest of today and you should NOT DRIVE or use heavy machinery until tomorrow (because of the sedation medicines used during the test).    FOLLOW UP: Our staff will call the number listed on your records the next business day following your procedure to check on you and address any questions or concerns that you may have regarding the information given to you following your procedure. If we do not reach you, we will leave a message.  However, if you are feeling well and you are not experiencing any problems, there is no need to return our call.  We will assume that you have returned to your regular daily activities without incident.  If any biopsies were taken you will be contacted by phone or by letter within the next 1-3 weeks.  Please call us at [phone] if you have not heard about the biopsies in 3 weeks.    SIGNATURES/CONFIDENTIALITY: You and/or your care partner have signed paperwork which will be entered into your electronic medical record.  These signatures attest to the fact that that the information above on your After Visit Summary has been reviewed and is understood.  Full responsibility of the confidentiality of this discharge information lies with you and/or your care-partner.  Perianal fistula.  Dr. Myrtie Neitheranis will be in touch regarding abdominal imaging and lab work.

## 2015-06-27 ENCOUNTER — Telehealth: Payer: Self-pay

## 2015-06-27 NOTE — Telephone Encounter (Signed)
  Follow up Call-  Call back number 06/26/2015  Post procedure Call Back phone  # [phone]  Permission to leave phone message Yes     Patient questions:  Do you have a fever, pain , or abdominal swelling? No. Pain Score  0 *  Have you tolerated food without any problems? Yes.    Have you been able to return to your normal activities? Yes.    Do you have any questions about your discharge instructions: Diet   No. Medications  No. Follow up visit  No.  Do you have questions or concerns about your Care? No.  Actions: * If pain score is 4 or above: No action needed, pain <4.

## 2015-07-01 ENCOUNTER — Telehealth: Payer: Self-pay | Admitting: Gastroenterology

## 2015-07-01 DIAGNOSIS — K603 Anal fistula: Secondary | ICD-10-CM

## 2015-07-01 NOTE — Telephone Encounter (Signed)
[name],    Please call Aundra MilletMegan and tell her I have reviewed her case some more with Dr. Maisie Fushomas and one of my partners.  As we discussed, Crohn's disease appears less and less likely since nothing found on colonoscopy.  Still, there are Crohn's patients who will only have a fistula but nothing in the small bowel or colon.  I think the next best test is a pelvic MRI with contrast (gadolinium).  It would be helpful to see if her insurance will cover it before placing the order.  Please see what Aundra MilletMegan thinks about that. Thanks. - HD

## 2015-07-02 NOTE — Telephone Encounter (Signed)
Left message on machine to call back  

## 2015-07-03 NOTE — Telephone Encounter (Signed)
[facility] an [facility]. Your appointment time is 8 am. Please arrive 15 minutes prior to your appointment time for registration purposes.  NO prep is needed for the MRI per radiology The pt has been instructed and will call with any questions,  Insurance will be contacted and the pt will be called with any problems

## 2015-07-07 ENCOUNTER — Ambulatory Visit (HOSPITAL_COMMUNITY): Admission: RE | Admit: 2015-07-07 | Payer: BC Managed Care – PPO | Source: Ambulatory Visit

## 2015-07-28 ENCOUNTER — Telehealth: Payer: Self-pay | Admitting: Gastroenterology

## 2015-07-28 NOTE — Telephone Encounter (Signed)
Patient is rescheduled to 08/01/15 arrive 245 for 3:00 pm Patient is notified of date and time, but she can't do that date either.  I have given her the number to Central scheduling and she will reschedule at her convenience.

## 2015-08-01 ENCOUNTER — Ambulatory Visit (HOSPITAL_COMMUNITY): Payer: BC Managed Care – PPO

## 2015-08-07 ENCOUNTER — Telehealth: Payer: Self-pay

## 2015-08-07 DIAGNOSIS — K603 Anal fistula: Secondary | ICD-10-CM

## 2015-08-07 NOTE — Telephone Encounter (Signed)
-----   Message from Henry L Danis III, MD sent at 08/07/2015 11:40 AM EDT ----- Makes no sense to need EGD prior to MRI, so please change to CT scan abdomen and pelvis with IV and oral contrast ----- Message -----    From: Jerek Meulemans L Lewis, RN    Sent: 08/07/2015  11:31 AM      To: Henry L Danis III, MD  Please advise ----- Message -----    From: [name]    Sent: 08/07/2015  11:19 AM      To: [name] Tarkowski L Lewis, RN  MRI PELVIS WILL NOT BE APPROVED WITH OUT EGD DONE.  CT COULD BE APPROVED.  CHANGE TO CT OR CALL FOR PEER TO PEER @ [phone] PATIENTS ID# [id number] NEED DONE ASAP.  PT SCHEDULED FOR TOMORROW.  PRECERT HAS TO BE IN BEFORE 2PM TODAY THANKS [name]   

## 2015-08-07 NOTE — Telephone Encounter (Signed)
You have been scheduled for a CT scan of the pelvis at [facility] ([address]---this is in the same building as Press [facility]).   You are scheduled on 08/08/15 at 430 pm. You should arrive 15 minutes prior to your appointment time for registration. Please follow the written instructions below on the day of your exam:  WARNING: IF YOU ARE ALLERGIC TO IODINE/X-RAY DYE, PLEASE NOTIFY RADIOLOGY IMMEDIATELY AT [phone]! YOU WILL BE GIVEN A 13 HOUR PREMEDICATION PREP.  1) Do not eat or drink anything after 1230 pm (4 hours prior to your test) 2) You have been given 2 bottles of oral contrast to drink. The solution may taste better if refrigerated, but do NOT add ice or any other liquid to this solution. Shake well before drinking.    Drink 1 bottle of contrast @ 230 pm (2 hours prior to your exam)  Drink 1 bottle of contrast @ 330 pm (1 hour prior to your exam)  You may take any medications as prescribed with a small amount of water except for the following: Metformin, Glucophage, Glucovance, Avandamet, Riomet, Fortamet, Actoplus Met, Janumet, Glumetza or Metaglip. The above medications must be held the day of the exam AND 48 hours after the exam.   Pt has been instructed and will call with any questions The purpose of you drinking the oral contrast is to aid in the visualization of your intestinal tract. The contrast solution may cause some diarrhea. Before your exam is started, you will be given a small amount of fluid to drink. Depending on your individual set of symptoms, you may also receive an intravenous injection of x-ray contrast/dye. Plan on being at [facility] for 30 minutes or longer, depending on the type of exam you are having performed.  This test typically takes 30-45 minutes to complete.  If you have any questions regarding your exam or if you need to reschedule, you may call the CT department at [phone] between the hours of 8:00 am and 5:00 pm,  Monday-Friday.  ________________________________________________________________________

## 2015-08-07 NOTE — Telephone Encounter (Signed)
-----   Message from Charlie PitterHenry L Danis III, MD sent at 08/07/2015 11:40 AM EDT ----- Makes no sense to need EGD prior to MRI, so please change to CT scan abdomen and pelvis with IV and oral contrast ----- Message -----    From: Donata DuffPatty L Lewis, RN    Sent: 08/07/2015  11:31 AM      To: Sherrilyn RistHenry L Danis III, MD  Please advise ----- Message -----    From: Libby MawAmy L Hazelwood    Sent: 08/07/2015  11:19 AM      To: Donata DuffPatty L Lewis, RN  MRI PELVIS WILL NOT BE APPROVED WITH OUT EGD DONE.  CT COULD BE APPROVED.  CHANGE TO CT OR CALL FOR PEER TO PEER @ [phone] PATIENTS ID# [id number] NEED DONE ASAP.  PT SCHEDULED FOR TOMORROW.  PRECERT HAS TO BE IN BEFORE 2PM TODAY THANKS AMY

## 2015-08-07 NOTE — Telephone Encounter (Signed)
See alternate note CT scheduled and pt aware

## 2015-08-08 ENCOUNTER — Ambulatory Visit (HOSPITAL_COMMUNITY): Admission: RE | Admit: 2015-08-08 | Payer: BC Managed Care – PPO | Source: Ambulatory Visit

## 2015-08-08 ENCOUNTER — Ambulatory Visit (INDEPENDENT_AMBULATORY_CARE_PROVIDER_SITE_OTHER)
Admission: RE | Admit: 2015-08-08 | Discharge: 2015-08-08 | Disposition: A | Discharge status: Home / Self Care | Payer: BC Managed Care – PPO | Source: Ambulatory Visit | Attending: Gastroenterology | Admitting: Gastroenterology

## 2015-08-08 DIAGNOSIS — K603 Anal fistula: Secondary | ICD-10-CM

## 2015-08-08 MED ORDER — IOPAMIDOL (ISOVUE-300) INJECTION 61%
100.0000 mL | Freq: Once | INTRAVENOUS | Status: AC | PRN
  Administered 2015-08-08: 100 mL via INTRAVENOUS

## 2016-08-21 IMAGING — CT CT PELVIS W/ CM
2 of 3 series · 16 of 46 positions shown, 18 images · IV contrast (ISOVUE 300)
Comparison: None.

CLINICAL DATA: Possible perianal fistula. Evaluate for Crohn's
disease.

EXAM:
CT PELVIS WITH CONTRAST
TECHNIQUE: Multidetector CT imaging of the pelvis was performed using the
standard protocol following the bolus administration of intravenous
contrast.
CONTRAST:  100mL KDX5U0-4RR IOPAMIDOL (KDX5U0-4RR) INJECTION 61%

[Series 3: pelvis st · axial · 0.79mm/px · z∈[-357,-122]mm · 13 of 55 slices shown, 15 images]
[im 4/55  soft-tissue]
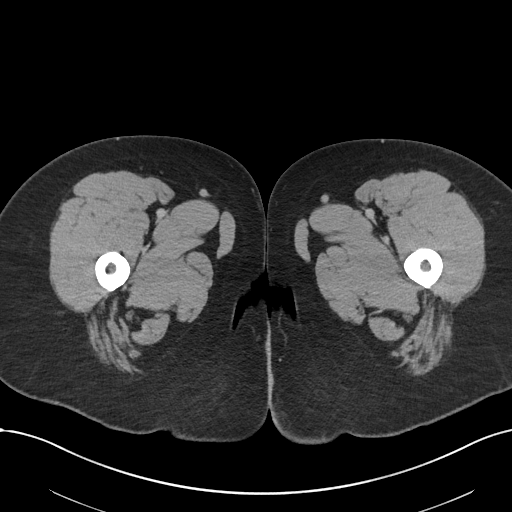
[im 4/55  bone]
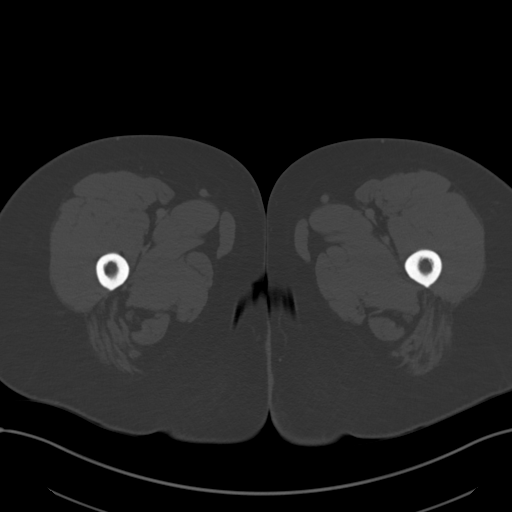
[im 7/55  soft-tissue]
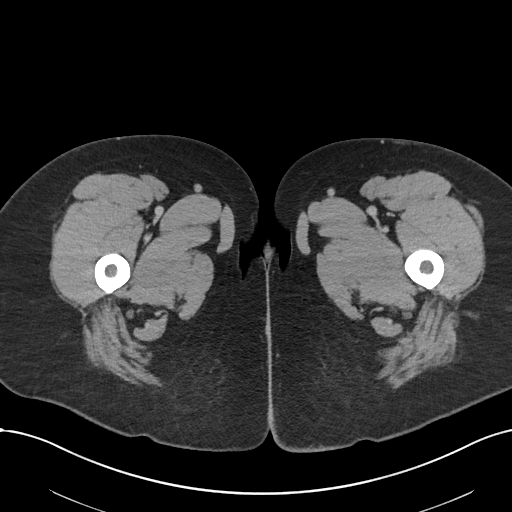
[im 11/55  soft-tissue]
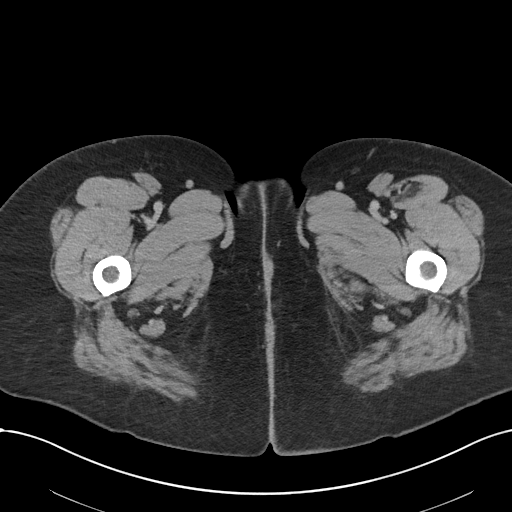
[im 16/55  soft-tissue]
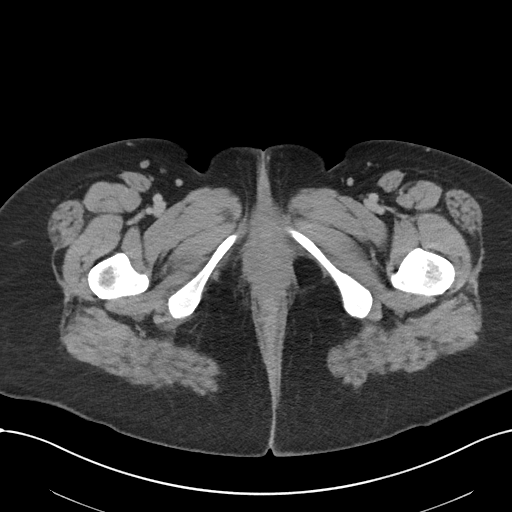
[im 20/55  soft-tissue]
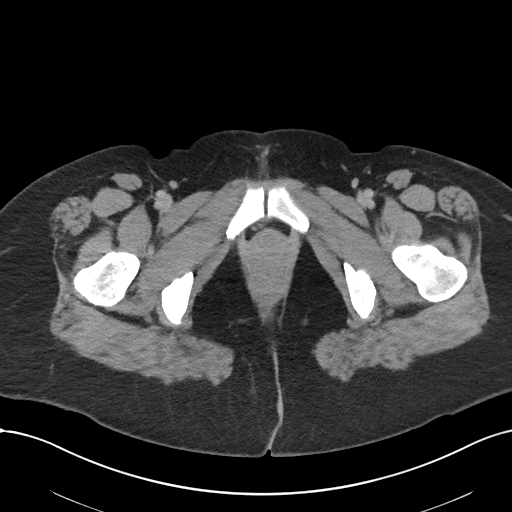
[im 23/55  soft-tissue]
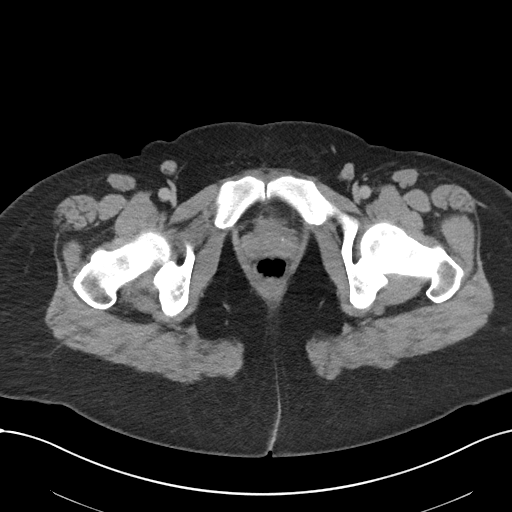
[im 28/55  soft-tissue]
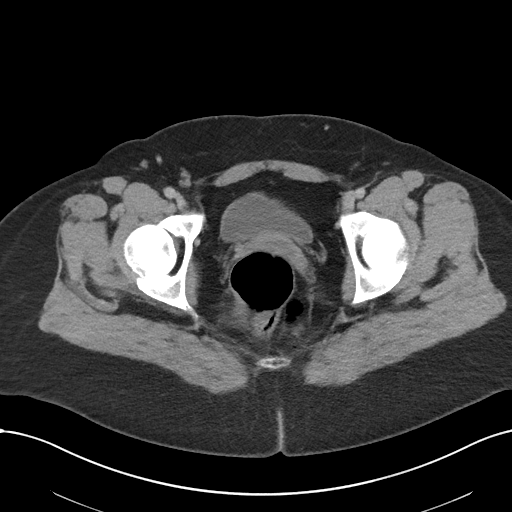
[im 32/55  soft-tissue]
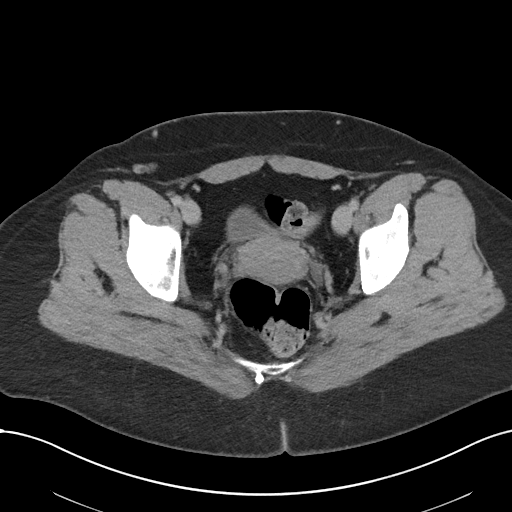
[im 35/55  soft-tissue]
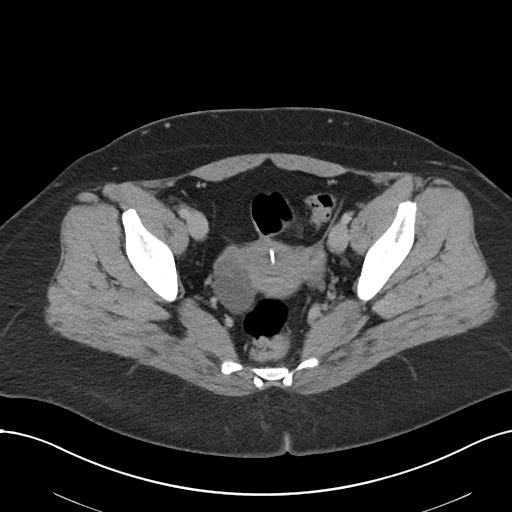
[im 35/55  bone]
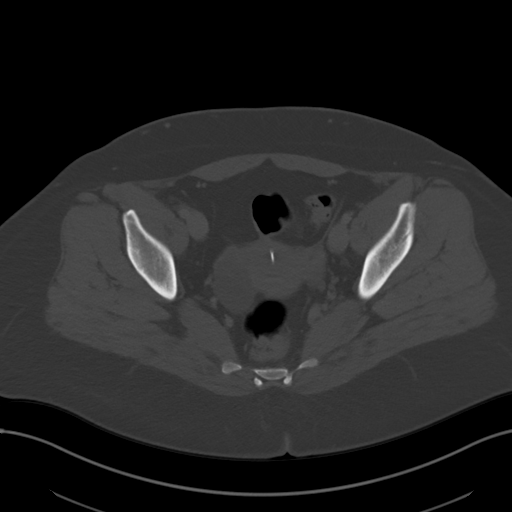
[im 39/55  soft-tissue]
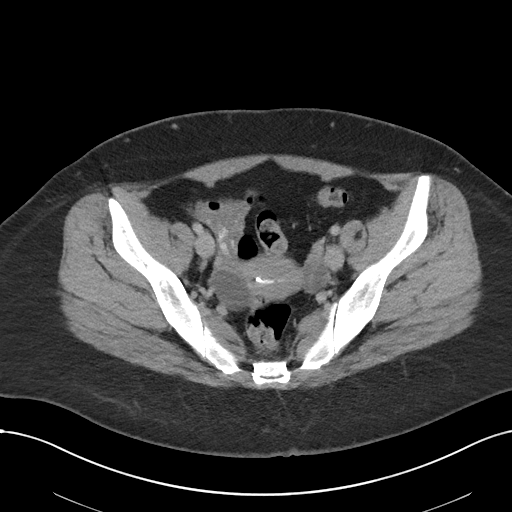
[im 44/55  soft-tissue]
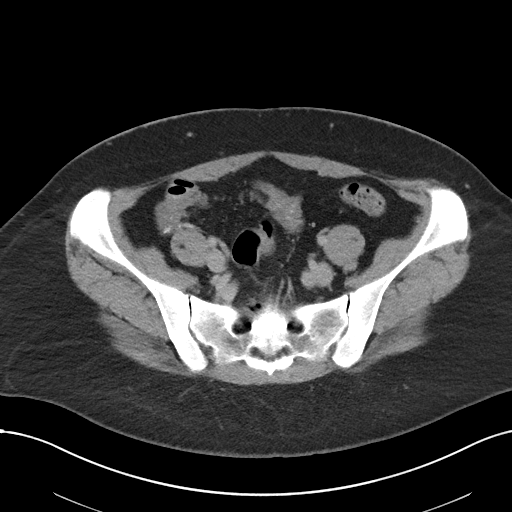
[im 48/55  soft-tissue]
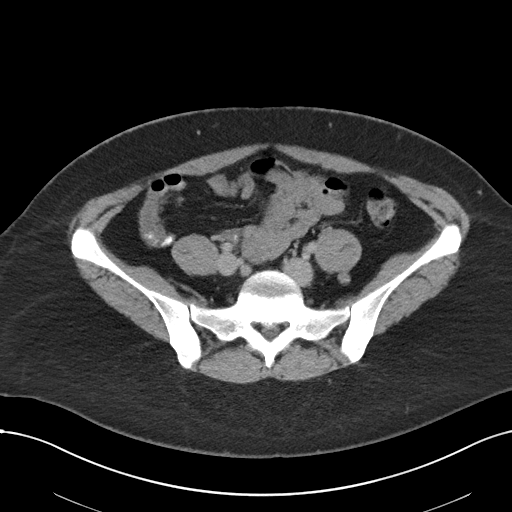
[im 51/55  soft-tissue]
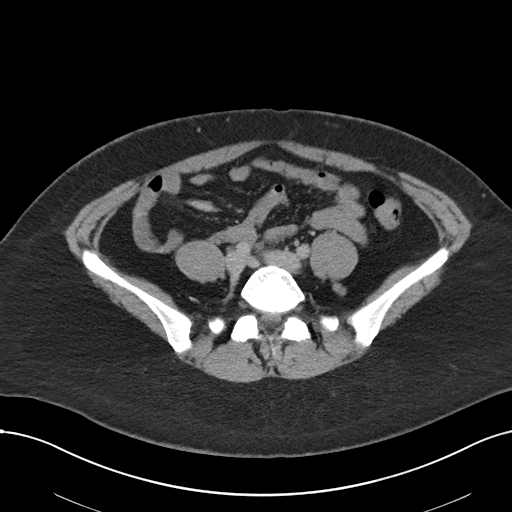

[Series 5: coronal st · coronal · 0.52mm/px · 3 of 85 slices shown]
[im 29/85  soft-tissue]
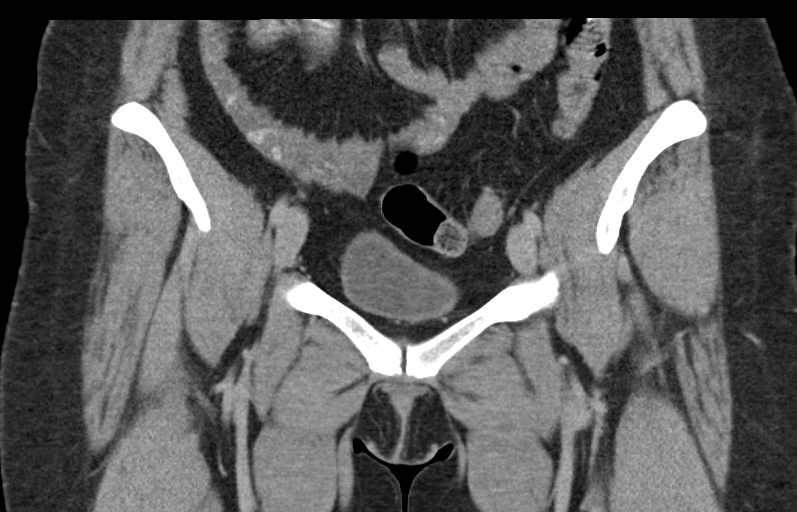
[im 38/85  soft-tissue]
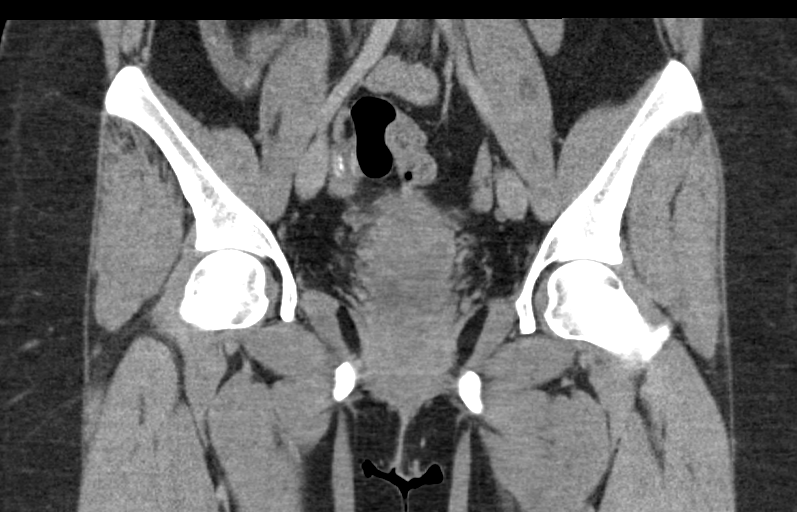
[im 47/85  soft-tissue]
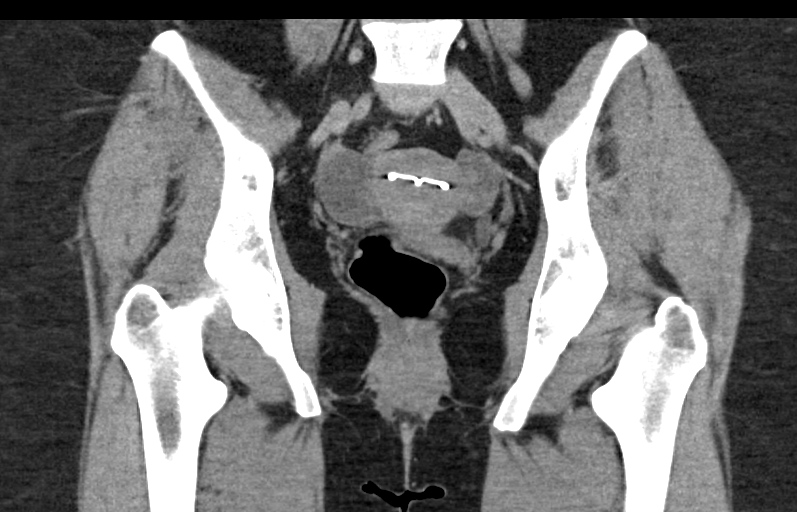

[16 of 46 positions shown; findings below may reference images not displayed]

FINDINGS: Examination demonstrates an IUD in adequate position over the
uterus. There is a 3.9 cm right ovarian cyst. Left ovary is within
normal. The bladder is normal. Rectosigmoid colon is within normal.
No free fluid or focal inflammatory change. No perianal inflammatory
change. Remaining bones and soft tissues are unremarkable.
IMPRESSION: No acute findings. No perianal inflammation or fluid to suggest
fistula.

IUD in adequate position.  3.9 cm right ovarian cyst.

## 2016-09-20 ENCOUNTER — Other Ambulatory Visit: Payer: Self-pay | Admitting: General Surgery

## 2016-09-20 NOTE — H&P (Signed)
History of Present Illness [name]([name] Mandujano MD; 09/20/2016 2:15 PM) The patient is a 31 year old female who presents with hemorrhoids. 31 year old female who I last saw me office for perianal excoriation who presents again with complaints of her perianal skin tag. The patient has a history of a complex pilonidal cystectomy by Dr. Luisa [name] in 2013. She states that she had a horrible time after that surgery because it was nonhealing. She states that she had some hemorrhoidal problems after that type of surgery. She then ended up having a pregnancy which worsened her hemorrhoids as well. She denies any extended periods of diarrhea or constipation. Upon evaluation she had a significant amount of perianal excoriation. I felt that a GI workup for inflammatory bowel disease was warranted. She underwent a colonoscopy with Dr.Danis, which showed no signs of Crohn's disease. She was diagnosed with hidradenitis and subsequently undergoing treatment with her dermatologist. She continues to have some perianal excoriation at midline and it is felt to be due to moisture from the large anal skin tag.   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  Diagnostic Studies History [name]([name] Briere, MD; 09/20/2016 2:15 PM) Colonoscopy never Mammogram never Pap Smear 1-5 years ago  Allergies [name]([name], RMA; 09/20/2016 1:59 PM) No Known Drug Allergies 04/03/2015  Medication History [name]([name], RMA; 09/20/2016 2:00 PM) ZyrTEC (10MG  Tablet, Oral at bedtime) Active. Zoloft (100MG  Tablet, Oral) Active. Medications Reconciled  Social History [name]([name] Stankovich, MD; 09/20/2016 2:15 PM) Caffeine use Carbonated beverages. No alcohol use No drug use Tobacco use Never  smoker.  Family History [name]([name] Spillane, MD; 09/20/2016 2:15 PM) Breast Cancer Mother. Heart Disease Father. Heart disease in female family member before age 32 Hypertension Father.  Pregnancy / Birth History [name]([name] Mitrano, MD; 09/20/2016 2:15 PM) Age at menarche 12 years. Contraceptive History Intrauterine device. Gravida 1 Maternal age 32-30 Para 1 Regular periods  Other Problems [name]([name] Maharaj, MD; 09/20/2016 2:15 PM) Anxiety Disorder Hemorrhoids Other disease, cancer, significant illness     Review of Systems [name]([name] Coles MD; 09/20/2016 2:15 PM) General Not Present- Appetite Loss, Chills, Fatigue, Fever, Night Sweats, Weight Gain and Weight Loss. Skin Not Present- Change in Wart/Mole, Dryness, Hives, Jaundice, New Lesions, Non-Healing Wounds, Rash and Ulcer. HEENT Not Present- Earache, Hearing Loss, Hoarseness, Nose Bleed, Oral Ulcers, Ringing in the Ears, Seasonal Allergies, Sinus Pain, Sore Throat, Visual Disturbances, Wears glasses/contact lenses and Yellow Eyes. Respiratory Not Present- Bloody sputum, Chronic Cough, Difficulty Breathing, Snoring and Wheezing. Breast Not Present- Breast Mass, Breast Pain, Nipple Discharge and Skin Changes. Cardiovascular Not Present- Chest Pain, Difficulty Breathing Lying Down, Leg Cramps, Palpitations, Rapid Heart Rate, Shortness of Breath and Swelling of Extremities. Gastrointestinal Present- Hemorrhoids. Not Present- Abdominal Pain, Bloating, Bloody Stool, Change in Bowel Habits, Chronic diarrhea, Constipation, Difficulty Swallowing, Excessive gas, Gets full quickly at meals, Indigestion, Nausea, Rectal Pain and Vomiting. Female Genitourinary Not Present- Frequency, Nocturia, Painful Urination, Pelvic Pain and Urgency. Musculoskeletal Not Present- Back Pain, Joint Pain, Joint Stiffness, Muscle Pain, Muscle Weakness and Swelling of Extremities. Neurological Not Present- Decreased Memory, Fainting, Headaches, Numbness, Seizures,  Tingling, Tremor, Trouble walking and Weakness. Psychiatric Not Present- Anxiety, Bipolar, Change in Sleep Pattern, Depression, Fearful and Frequent crying. Endocrine Not Present- Cold Intolerance, Excessive Hunger, Hair Changes, Heat Intolerance, Hot flashes and New Diabetes. Hematology Not Present-  Easy Bruising, Excessive bleeding, Gland problems, HIV and Persistent Infections.  Vitals [name]([name] RMA; 09/20/2016 2:01 PM) 09/20/2016 2:00 PM Weight: 208.2 lb Height: 62in Body Surface Area: 1.94 m Body Mass Index: 38.08 kg/m  Temp.: 45F  Pulse: 102 (Regular)  BP: 126/80 (Sitting, Left Arm, Standard)      Physical Exam [name]([name] Mehringer MD; 09/20/2016 2:16 PM)  General Mental Status-Alert. General Appearance-Consistent with stated age. Hydration-Well hydrated. Voice-Normal.  Head and Neck Head-normocephalic, atraumatic with no lesions or palpable masses. Trachea-midline.  Eye Eyeball - Bilateral-Extraocular movements intact. Sclera/Conjunctiva - Bilateral-No scleral icterus.  Chest and Lung Exam Chest and lung exam reveals -quiet, even and easy respiratory effort with no use of accessory muscles and on auscultation, normal breath sounds, no adventitious sounds and normal vocal resonance. Inspection Chest Wall - Normal. Back - normal.  Cardiovascular Cardiovascular examination reveals -normal heart sounds, regular rate and rhythm with no murmurs and normal pedal pulses bilaterally.  Abdomen Inspection Inspection of the abdomen reveals - No Hernias. Skin - Scar - no surgical scars. Palpation/Percussion Palpation and Percussion of the abdomen reveal - Soft, Non Tender, No Rebound tenderness, No Rigidity (guarding) and No hepatosplenomegaly.  Rectal Note: Visual inspection of the perineal area reveals a hypertrophied skin tag coming off an external hemorrhoid. This is on the right side primarily. In the posterior midline starting at the  anal verge extending for at least 3 cm back there is a linear tear in her scan with a shallow wound. There is no surrounding cellulitis or induration.   Neurologic Neurologic evaluation reveals -alert and oriented x 3 with no impairment of recent or remote memory. Mental Status-Normal.  Neuropsychiatric The patient's mood and affect are described as -normal. Judgment and Insight-insight is appropriate concerning matters relevant to self.  Musculoskeletal Normal Exam - Left-Upper Extremity Strength Normal and Lower Extremity Strength Normal. Normal Exam - Right-Upper Extremity Strength Normal and Lower Extremity Strength Normal.    Assessment & Plan [name](Kayela Humphres MD; 09/20/2016 2:13 PM)  ANAL SKIN TAG (K64.4) Impression: 31 year old female who presents to the office for evaluation of her perianal disease. She has been diagnosed with hidradenitis and is currently undergoing treatment with her dermatologist. She has a large perirectal skin tag which appears to be contributing to her chronic wound and moisture issues. I have recommended excision in the operating room. We have discussed that this could lead to difficulty healing of the wound itself. Other risks include recurrence and bleeding. I believe she understands this and is willing to undergo the procedure.

## 2016-09-21 ENCOUNTER — Encounter (HOSPITAL_BASED_OUTPATIENT_CLINIC_OR_DEPARTMENT_OTHER): Payer: Self-pay | Admitting: *Deleted

## 2016-09-21 NOTE — Progress Notes (Signed)
[facility]urine pregnancy on [facility]. Npo after Mn.

## 2016-10-01 ENCOUNTER — Ambulatory Visit (HOSPITAL_BASED_OUTPATIENT_CLINIC_OR_DEPARTMENT_OTHER): Payer: BC Managed Care – PPO | Admitting: Anesthesiology

## 2016-10-01 ENCOUNTER — Ambulatory Visit (HOSPITAL_BASED_OUTPATIENT_CLINIC_OR_DEPARTMENT_OTHER)
Admission: RE | Admit: 2016-10-01 | Discharge: 2016-10-01 | Disposition: A | Discharge status: Home / Self Care | Payer: BC Managed Care – PPO | Source: Ambulatory Visit | Attending: General Surgery | Admitting: General Surgery

## 2016-10-01 ENCOUNTER — Encounter (HOSPITAL_BASED_OUTPATIENT_CLINIC_OR_DEPARTMENT_OTHER): Payer: Self-pay | Admitting: *Deleted

## 2016-10-01 ENCOUNTER — Encounter (HOSPITAL_BASED_OUTPATIENT_CLINIC_OR_DEPARTMENT_OTHER)
Admission: RE | Disposition: A | Discharge status: Home / Self Care | Payer: Self-pay | Source: Ambulatory Visit | Attending: General Surgery

## 2016-10-01 DIAGNOSIS — K644 Residual hemorrhoidal skin tags: Secondary | ICD-10-CM | POA: Insufficient documentation

## 2016-10-01 DIAGNOSIS — L918 Other hypertrophic disorders of the skin: Secondary | ICD-10-CM | POA: Insufficient documentation

## 2016-10-01 DIAGNOSIS — Z79899 Other long term (current) drug therapy: Secondary | ICD-10-CM | POA: Diagnosis not present

## 2016-10-01 DIAGNOSIS — F419 Anxiety disorder, unspecified: Secondary | ICD-10-CM | POA: Insufficient documentation

## 2016-10-01 HISTORY — PX: EXCISION OF SKIN TAG: SHX6270

## 2016-10-01 LAB — POCT PREGNANCY, URINE: Preg Test, Ur: NEGATIVE

## 2016-10-01 LAB — POCT HEMOGLOBIN-HEMACUE: Hemoglobin: 14.3 g/dL (ref 12.0–15.0)

## 2016-10-01 SURGERY — EXCISION, SKIN TAG
Anesthesia: Monitor Anesthesia Care | Site: Rectum

## 2016-10-01 MED ORDER — DEXAMETHASONE SODIUM PHOSPHATE 10 MG/ML IJ SOLN
INTRAMUSCULAR | Status: AC
  Filled 2016-10-01: qty 1

## 2016-10-01 MED ORDER — ONDANSETRON HCL 4 MG/2ML IJ SOLN
INTRAMUSCULAR | Status: DC | PRN
  Administered 2016-10-01: 4 mg via INTRAVENOUS

## 2016-10-01 MED ORDER — MIDAZOLAM HCL 5 MG/5ML IJ SOLN
INTRAMUSCULAR | Status: DC | PRN
  Administered 2016-10-01: 2 mg via INTRAVENOUS

## 2016-10-01 MED ORDER — LIDOCAINE HCL (CARDIAC) 20 MG/ML IV SOLN
INTRAVENOUS | Status: DC | PRN
  Administered 2016-10-01: 60 mg via INTRAVENOUS

## 2016-10-01 MED ORDER — HYDROCODONE-ACETAMINOPHEN 7.5-325 MG PO TABS
1.0000 | ORAL_TABLET | Freq: Once | ORAL | Status: DC | PRN
  Filled 2016-10-01: qty 1

## 2016-10-01 MED ORDER — OXYCODONE HCL 5 MG PO TABS: 5.0000 mg | ORAL_TABLET | ORAL | 0 refills | Status: DC | PRN

## 2016-10-01 MED ORDER — PROPOFOL 500 MG/50ML IV EMUL
INTRAVENOUS | Status: DC | PRN
  Administered 2016-10-01: 25 ug/kg/min via INTRAVENOUS

## 2016-10-01 MED ORDER — BUPIVACAINE-EPINEPHRINE 0.5% -1:200000 IJ SOLN
INTRAMUSCULAR | Status: DC | PRN
  Administered 2016-10-01: 30 mL

## 2016-10-01 MED ORDER — SODIUM CHLORIDE 0.9% FLUSH
3.0000 mL | INTRAVENOUS | Status: DC | PRN
  Filled 2016-10-01: qty 3

## 2016-10-01 MED ORDER — OXYCODONE HCL 5 MG PO TABS
5.0000 mg | ORAL_TABLET | ORAL | Status: DC | PRN
  Filled 2016-10-01: qty 2

## 2016-10-01 MED ORDER — ACETAMINOPHEN 325 MG PO TABS
650.0000 mg | ORAL_TABLET | ORAL | Status: DC | PRN
  Filled 2016-10-01: qty 2

## 2016-10-01 MED ORDER — LIDOCAINE 2% (20 MG/ML) 5 ML SYRINGE
INTRAMUSCULAR | Status: AC
  Filled 2016-10-01: qty 5

## 2016-10-01 MED ORDER — ACETAMINOPHEN 650 MG RE SUPP
650.0000 mg | RECTAL | Status: DC | PRN
  Filled 2016-10-01: qty 1

## 2016-10-01 MED ORDER — FENTANYL CITRATE (PF) 100 MCG/2ML IJ SOLN
INTRAMUSCULAR | Status: DC | PRN
  Administered 2016-10-01 (×4): 25 ug via INTRAVENOUS

## 2016-10-01 MED ORDER — LIDOCAINE 5 % EX OINT
TOPICAL_OINTMENT | CUTANEOUS | Status: DC | PRN
  Administered 2016-10-01: 1

## 2016-10-01 MED ORDER — MIDAZOLAM HCL 2 MG/2ML IJ SOLN
INTRAMUSCULAR | Status: AC
  Filled 2016-10-01: qty 2

## 2016-10-01 MED ORDER — PROPOFOL 500 MG/50ML IV EMUL
INTRAVENOUS | Status: AC
  Filled 2016-10-01: qty 50

## 2016-10-01 MED ORDER — SODIUM CHLORIDE 0.9% FLUSH
3.0000 mL | Freq: Two times a day (BID) | INTRAVENOUS | Status: DC
  Filled 2016-10-01: qty 3

## 2016-10-01 MED ORDER — LACTATED RINGERS IV SOLN
INTRAVENOUS | Status: DC
  Administered 2016-10-01: 10:00:00 via INTRAVENOUS
  Filled 2016-10-01: qty 1000

## 2016-10-01 MED ORDER — PROPOFOL 10 MG/ML IV BOLUS
INTRAVENOUS | Status: DC | PRN
  Administered 2016-10-01: 30 mg via INTRAVENOUS
  Administered 2016-10-01: 20 mg via INTRAVENOUS
  Administered 2016-10-01: 40 mg via INTRAVENOUS

## 2016-10-01 MED ORDER — BUPIVACAINE LIPOSOME 1.3 % IJ SUSP
INTRAMUSCULAR | Status: DC | PRN
  Administered 2016-10-01: 20 mL

## 2016-10-01 MED ORDER — FENTANYL CITRATE (PF) 100 MCG/2ML IJ SOLN
INTRAMUSCULAR | Status: AC
  Filled 2016-10-01: qty 2

## 2016-10-01 MED ORDER — PROMETHAZINE HCL 25 MG/ML IJ SOLN
6.2500 mg | INTRAMUSCULAR | Status: DC | PRN
  Filled 2016-10-01: qty 1

## 2016-10-01 MED ORDER — ONDANSETRON HCL 4 MG/2ML IJ SOLN
INTRAMUSCULAR | Status: AC
  Filled 2016-10-01: qty 2

## 2016-10-01 MED ORDER — KETOROLAC TROMETHAMINE 30 MG/ML IJ SOLN
INTRAMUSCULAR | Status: DC | PRN
  Administered 2016-10-01: 30 mg via INTRAVENOUS

## 2016-10-01 MED ORDER — DEXAMETHASONE SODIUM PHOSPHATE 4 MG/ML IJ SOLN
INTRAMUSCULAR | Status: DC | PRN
  Administered 2016-10-01: 10 mg via INTRAVENOUS

## 2016-10-01 MED ORDER — FENTANYL CITRATE (PF) 100 MCG/2ML IJ SOLN
25.0000 ug | INTRAMUSCULAR | Status: DC | PRN
  Filled 2016-10-01: qty 1

## 2016-10-01 MED ORDER — SODIUM CHLORIDE 0.9 % IV SOLN
250.0000 mL | INTRAVENOUS | Status: DC | PRN
  Filled 2016-10-01: qty 250

## 2016-10-01 SURGICAL SUPPLY — 55 items
ADH SKN CLS APL DERMABOND .7 (GAUZE/BANDAGES/DRESSINGS) ×1
APL SKNCLS STERI-STRIP NONHPOA (GAUZE/BANDAGES/DRESSINGS)
BENZOIN TINCTURE PRP APPL 2/3 (GAUZE/BANDAGES/DRESSINGS) IMPLANT
BLADE CLIPPER SURG (BLADE) IMPLANT
BLADE SURG 15 STRL LF DISP TIS (BLADE) IMPLANT
BLADE SURG 15 STRL SS (BLADE)
BRIEF STRETCH FOR OB PAD LRG (UNDERPADS AND DIAPERS) ×3 IMPLANT
CHLORAPREP W/TINT 26ML (MISCELLANEOUS) ×3 IMPLANT
CLOSURE WOUND 1/2 X4 (GAUZE/BANDAGES/DRESSINGS)
COVER BACK TABLE 60X90IN (DRAPES) ×3 IMPLANT
COVER MAYO STAND STRL (DRAPES) ×3 IMPLANT
DECANTER SPIKE VIAL GLASS SM (MISCELLANEOUS) IMPLANT
DERMABOND ADVANCED (GAUZE/BANDAGES/DRESSINGS) ×2
DERMABOND ADVANCED .7 DNX12 (GAUZE/BANDAGES/DRESSINGS) ×1 IMPLANT
DRAPE LAPAROTOMY 100X72 PEDS (DRAPES) ×3 IMPLANT
DRAPE LG THREE QUARTER DISP (DRAPES) IMPLANT
DRAPE UNDERBUTTOCKS STRL (DRAPE) IMPLANT
DRAPE UTILITY XL STRL (DRAPES) ×3 IMPLANT
DRSG PAD ABDOMINAL 8X10 ST (GAUZE/BANDAGES/DRESSINGS) ×3 IMPLANT
DRSG TEGADERM 4X4.75 (GAUZE/BANDAGES/DRESSINGS) IMPLANT
ELECT REM PT RETURN 9FT ADLT (ELECTROSURGICAL) ×3
ELECTRODE REM PT RTRN 9FT ADLT (ELECTROSURGICAL) ×1 IMPLANT
GAUZE SPONGE 4X4 8PLY STR LF (GAUZE/BANDAGES/DRESSINGS) ×3 IMPLANT
GLOVE BIO SURGEON STRL SZ 6.5 (GLOVE) ×4 IMPLANT
GLOVE BIO SURGEONS STRL SZ 6.5 (GLOVE) ×2
GLOVE INDICATOR 7.0 STRL GRN (GLOVE) ×3 IMPLANT
GOWN STRL REUS W/ TWL XL LVL3 (GOWN DISPOSABLE) ×1 IMPLANT
GOWN STRL REUS W/TWL 2XL LVL3 (GOWN DISPOSABLE) ×3 IMPLANT
GOWN STRL REUS W/TWL XL LVL3 (GOWN DISPOSABLE) ×3
KIT RM TURNOVER CYSTO AR (KITS) ×3 IMPLANT
LEGGING LITHOTOMY PAIR STRL (DRAPES) IMPLANT
NEEDLE HYPO 22GX1.5 SAFETY (NEEDLE) ×3 IMPLANT
NS IRRIG 500ML POUR BTL (IV SOLUTION) IMPLANT
PAD ARMBOARD 7.5X6 YLW CONV (MISCELLANEOUS) IMPLANT
PENCIL BUTTON HOLSTER BLD 10FT (ELECTRODE) ×3 IMPLANT
SPONGE GAUZE 4X4 12PLY (GAUZE/BANDAGES/DRESSINGS) IMPLANT
STRIP CLOSURE SKIN 1/2X4 (GAUZE/BANDAGES/DRESSINGS) IMPLANT
SUT CHROMIC 3 0 SH 27 (SUTURE) IMPLANT
SUT ETHILON 2 0 FS 18 (SUTURE) IMPLANT
SUT ETHILON 4 0 PS 2 18 (SUTURE) IMPLANT
SUT MNCRL AB 4-0 PS2 18 (SUTURE) ×3 IMPLANT
SUT SILK 2 0 SH (SUTURE) IMPLANT
SUT VIC AB 2-0 SH 27 (SUTURE)
SUT VIC AB 2-0 SH 27XBRD (SUTURE) IMPLANT
SUT VICRYL 4-0 PS2 18IN ABS (SUTURE) IMPLANT
SYR CONTROL 10ML LL (SYRINGE) ×3 IMPLANT
TOWEL OR 17X24 6PK STRL BLUE (TOWEL DISPOSABLE) ×3 IMPLANT
TRAY DSU PREP LF (CUSTOM PROCEDURE TRAY) ×3 IMPLANT
TUBE ANAEROBIC SPECIMEN COL (MISCELLANEOUS) IMPLANT
TUBE CONNECTING 12'X1/4 (SUCTIONS)
TUBE CONNECTING 12X1/4 (SUCTIONS) IMPLANT
UNDERPAD 30X30 INCONTINENT (UNDERPADS AND DIAPERS) IMPLANT
VACUUM HOSE 7/8X10 W/ WAND (MISCELLANEOUS) IMPLANT
WATER STERILE IRR 1000ML POUR (IV SOLUTION) ×3 IMPLANT
YANKAUER SUCT BULB TIP NO VENT (SUCTIONS) IMPLANT

## 2016-10-01 NOTE — Op Note (Addendum)
10/01/2016  11:34 AM  PATIENT:  [name]  31 y.o. female  Patient Care Team: Aloha Gell, MD as PCP - General (Obstetrics and Gynecology)  PRE-OPERATIVE DIAGNOSIS:  perianal skin tag  POST-OPERATIVE DIAGNOSIS:  perianal skin tag  PROCEDURE:  Procedure(s): EXCISION OF PERIANAL SKIN TAG    Surgeon(s): Leighton Ruff, MD  ASSISTANT: none   ANESTHESIA:   local and MAC  SPECIMEN:  Source of Specimen:  anal skin tag  DISPOSITION OF SPECIMEN:  PATHOLOGY  COUNTS:  YES  PLAN OF CARE: Discharge to home after PACU  PATIENT DISPOSITION:  PACU - hemodynamically stable.  INDICATION: 31 y.o. F with enlarging anal skin tag felt to be inhibiting healing of a chronic anal wound   OR FINDINGS: L posterior anal skin tag  DESCRIPTION: the patient was identified in the preoperative holding area and taken to the OR where they were laid on the operating room table.  MAC anesthesia was induced without difficulty. The patient was then positioned in prone jackknife position with buttocks gently taped apart.  The patient was then prepped and draped in usual sterile fashion.  SCDs were noted to be in place prior to the initiation of anesthesia. A surgical timeout was performed indicating the correct patient, procedure, positioning and need for preoperative antibiotics.  A rectal block was performed using Marcaine with epinephrine and exparel.    I began with a digital rectal exam.  There were no abnormal masses palpated internally.  I then placed a Hill-Ferguson anoscope into the anal canal and evaluated this completely.  The patient had a posterior midline anal wound with an anal skin tag arising from the lateral portion of this wound on the left side. The skin tag was excised using Metzenbaum scissors. The mucosa was reapproximated using 2-0 chromic sutures. The patient tolerated this well and was sent to the postanesthesia care unit in stable condition. All counts are correct per operating  room staff.    I have reviewed the [facility] and see no other  narcotic prescriptions listed for this patient.

## 2016-10-01 NOTE — Transfer of Care (Signed)
Immediate Anesthesia Transfer of Care Note  Patient: [name]  Procedure(s) Performed: Procedure(s) (LRB): EXCISION OF PERIANAL SKIN TAG (N/A)  Patient Location: PACU  Anesthesia Type: General  Level of Consciousness: awake, sedated, patient cooperative and responds to stimulation  Airway & Oxygen Therapy: Patient Spontanous Breathing and Patient connected to RA  Post-op Assessment: Report given to PACU RN, Post -op Vital signs reviewed and stable and Patient moving all extremities  Post vital signs: Reviewed and stable  Complications: No apparent anesthesia complications

## 2016-10-01 NOTE — Anesthesia Preprocedure Evaluation (Addendum)
Anesthesia Evaluation  Patient identified by MRN, date of birth, ID band Patient awake    Reviewed: Allergy & Precautions, NPO status , Patient's Chart, lab work & pertinent test results  History of Anesthesia Complications (+) PONV  Airway Mallampati: II  TM Distance: >3 FB Neck ROM: Full    Dental   Pulmonary neg pulmonary ROS,    breath sounds clear to auscultation       Cardiovascular negative cardio ROS   Rhythm:Regular Rate:Normal     Neuro/Psych  Headaches, Anxiety    GI/Hepatic negative GI ROS, Neg liver ROS,   Endo/Other  negative endocrine ROS  Renal/GU negative Renal ROS     Musculoskeletal  (+) Arthritis ,   Abdominal   Peds  Hematology negative hematology ROS (+)   Anesthesia Other Findings   Reproductive/Obstetrics                            Anesthesia Physical Anesthesia Plan  ASA: II  Anesthesia Plan: MAC   Post-op Pain Management:    Induction: Intravenous  PONV Risk Score and Plan: 3 and Ondansetron, Dexamethasone, Midazolam, Propofol infusion and Treatment may vary due to age or medical condition  Airway Management Planned: Natural Airway and Simple Face Mask  Additional Equipment:   Intra-op Plan:   Post-operative Plan:   Informed Consent: I have reviewed the patients History and Physical, chart, labs and discussed the procedure including the risks, benefits and alternatives for the proposed anesthesia with the patient or authorized representative who has indicated his/her understanding and acceptance.     Plan Discussed with: CRNA  Anesthesia Plan Comments:        Anesthesia Quick Evaluation

## 2016-10-01 NOTE — H&P (View-Only) (Signed)
History of Present Illness Romie Levee(Reiko Vinje MD; 09/20/2016 2:15 PM) The patient is a 31 year old female who presents with hemorrhoids. 31 year old female who I last saw me office for perianal excoriation who presents again with complaints of her perianal skin tag. The patient has a history of a complex pilonidal cystectomy by Dr. Luisa Hartornett in 2013. She states that she had a horrible time after that surgery because it was nonhealing. She states that she had some hemorrhoidal problems after that type of surgery. She then ended up having a pregnancy which worsened her hemorrhoids as well. She denies any extended periods of diarrhea or constipation. Upon evaluation she had a significant amount of perianal excoriation. I felt that a GI workup for inflammatory bowel disease was warranted. She underwent a colonoscopy with Dr.Danis, which showed no signs of Crohn's disease. She was diagnosed with hidradenitis and subsequently undergoing treatment with her dermatologist. She continues to have some perianal excoriation at midline and it is felt to be due to moisture from the large anal skin tag.   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  Diagnostic Studies History Romie Levee(Harrol Novello, MD; 09/20/2016 2:15 PM) Colonoscopy never Mammogram never Pap Smear 1-5 years ago  Allergies [name]([name], RMA; 09/20/2016 1:59 PM) No Known Drug Allergies 04/03/2015  Medication History [name]([name], RMA; 09/20/2016 2:00 PM) ZyrTEC (10MG  Tablet, Oral at bedtime) Active. Zoloft (100MG  Tablet, Oral) Active. Medications Reconciled  Social History Romie Levee(Gracielynn Birkel, MD; 09/20/2016 2:15 PM) Caffeine use Carbonated beverages. No alcohol use No drug use Tobacco use Never  smoker.  Family History Romie Levee(Niels Cranshaw, MD; 09/20/2016 2:15 PM) Breast Cancer Mother. Heart Disease Father. Heart disease in female family member before age 32 Hypertension Father.  Pregnancy / Birth History Romie Levee(Anahli Arvanitis, MD; 09/20/2016 2:15 PM) Age at menarche 12 years. Contraceptive History Intrauterine device. Gravida 1 Maternal age 32-30 Para 1 Regular periods  Other Problems Romie Levee(Brieann Osinski, MD; 09/20/2016 2:15 PM) Anxiety Disorder Hemorrhoids Other disease, cancer, significant illness     Review of Systems Romie Levee(Laelani Vasko MD; 09/20/2016 2:15 PM) General Not Present- Appetite Loss, Chills, Fatigue, Fever, Night Sweats, Weight Gain and Weight Loss. Skin Not Present- Change in Wart/Mole, Dryness, Hives, Jaundice, New Lesions, Non-Healing Wounds, Rash and Ulcer. HEENT Not Present- Earache, Hearing Loss, Hoarseness, Nose Bleed, Oral Ulcers, Ringing in the Ears, Seasonal Allergies, Sinus Pain, Sore Throat, Visual Disturbances, Wears glasses/contact lenses and Yellow Eyes. Respiratory Not Present- Bloody sputum, Chronic Cough, Difficulty Breathing, Snoring and Wheezing. Breast Not Present- Breast Mass, Breast Pain, Nipple Discharge and Skin Changes. Cardiovascular Not Present- Chest Pain, Difficulty Breathing Lying Down, Leg Cramps, Palpitations, Rapid Heart Rate, Shortness of Breath and Swelling of Extremities. Gastrointestinal Present- Hemorrhoids. Not Present- Abdominal Pain, Bloating, Bloody Stool, Change in Bowel Habits, Chronic diarrhea, Constipation, Difficulty Swallowing, Excessive gas, Gets full quickly at meals, Indigestion, Nausea, Rectal Pain and Vomiting. Female Genitourinary Not Present- Frequency, Nocturia, Painful Urination, Pelvic Pain and Urgency. Musculoskeletal Not Present- Back Pain, Joint Pain, Joint Stiffness, Muscle Pain, Muscle Weakness and Swelling of Extremities. Neurological Not Present- Decreased Memory, Fainting, Headaches, Numbness, Seizures,  Tingling, Tremor, Trouble walking and Weakness. Psychiatric Not Present- Anxiety, Bipolar, Change in Sleep Pattern, Depression, Fearful and Frequent crying. Endocrine Not Present- Cold Intolerance, Excessive Hunger, Hair Changes, Heat Intolerance, Hot flashes and New Diabetes. Hematology Not Present-  Easy Bruising, Excessive bleeding, Gland problems, HIV and Persistent Infections.  Vitals [name]([name] RMA; 09/20/2016 2:01 PM) 09/20/2016 2:00 PM Weight: 208.2 lb Height: 62in Body Surface Area: 1.94 m Body Mass Index: 38.08 kg/m  Temp.: 45F  Pulse: 102 (Regular)  BP: 126/80 (Sitting, Left Arm, Standard)      Physical Exam Romie Levee(Niraj Kudrna MD; 09/20/2016 2:16 PM)  General Mental Status-Alert. General Appearance-Consistent with stated age. Hydration-Well hydrated. Voice-Normal.  Head and Neck Head-normocephalic, atraumatic with no lesions or palpable masses. Trachea-midline.  Eye Eyeball - Bilateral-Extraocular movements intact. Sclera/Conjunctiva - Bilateral-No scleral icterus.  Chest and Lung Exam Chest and lung exam reveals -quiet, even and easy respiratory effort with no use of accessory muscles and on auscultation, normal breath sounds, no adventitious sounds and normal vocal resonance. Inspection Chest Wall - Normal. Back - normal.  Cardiovascular Cardiovascular examination reveals -normal heart sounds, regular rate and rhythm with no murmurs and normal pedal pulses bilaterally.  Abdomen Inspection Inspection of the abdomen reveals - No Hernias. Skin - Scar - no surgical scars. Palpation/Percussion Palpation and Percussion of the abdomen reveal - Soft, Non Tender, No Rebound tenderness, No Rigidity (guarding) and No hepatosplenomegaly.  Rectal Note: Visual inspection of the perineal area reveals a hypertrophied skin tag coming off an external hemorrhoid. This is on the right side primarily. In the posterior midline starting at the  anal verge extending for at least 3 cm back there is a linear tear in her scan with a shallow wound. There is no surrounding cellulitis or induration.   Neurologic Neurologic evaluation reveals -alert and oriented x 3 with no impairment of recent or remote memory. Mental Status-Normal.  Neuropsychiatric The patient's mood and affect are described as -normal. Judgment and Insight-insight is appropriate concerning matters relevant to self.  Musculoskeletal Normal Exam - Left-Upper Extremity Strength Normal and Lower Extremity Strength Normal. Normal Exam - Right-Upper Extremity Strength Normal and Lower Extremity Strength Normal.    Assessment & Plan Romie Levee(Hydeia Mcatee MD; 09/20/2016 2:13 PM)  ANAL SKIN TAG (K64.4) Impression: 31 year old female who presents to the office for evaluation of her perianal disease. She has been diagnosed with hidradenitis and is currently undergoing treatment with her dermatologist. She has a large perirectal skin tag which appears to be contributing to her chronic wound and moisture issues. I have recommended excision in the operating room. We have discussed that this could lead to difficulty healing of the wound itself. Other risks include recurrence and bleeding. I believe she understands this and is willing to undergo the procedure.

## 2016-10-01 NOTE — Anesthesia Procedure Notes (Signed)
Procedure Name: MAC Date/Time: 10/01/2016 11:02 AM Performed by: Justice Rocher Pre-anesthesia Checklist: Patient identified, Timeout performed, Patient being monitored, Suction available and Emergency Drugs available Patient Re-evaluated:Patient Re-evaluated prior to induction Oxygen Delivery Method: Simple face mask Preoxygenation: Pre-oxygenation with 100% oxygen Induction Type: IV induction Placement Confirmation: positive ETCO2 and breath sounds checked- equal and bilateral

## 2016-10-01 NOTE — Interval H&P Note (Signed)
History and Physical Interval Note:  10/01/2016 9:41 AM  [name]  has presented today for surgery, with the diagnosis of enlarging perianal skin tag  The various methods of treatment have been discussed with the patient and family. After consideration of risks, benefits and other options for treatment, the patient has consented to  Procedure(s): EXCISION OF PERIANAL SKIN TAG (N/A) as a surgical intervention .  The patient's history has been reviewed, patient examined, no change in status, stable for surgery.  I have reviewed the patient's chart and labs.  Questions were answered to the patient's satisfaction.     [name] PandaAlicia C Graysyn Bache, MD  Colorectal and General [facility]

## 2016-10-01 NOTE — Discharge Instructions (Addendum)
ANORECTAL SURGERY: POST OP INSTRUCTIONS °1. Take your usually prescribed home medications unless otherwise directed. °2. DIET: During the first few hours after surgery sip on some liquids until you are able to urinate.  It is normal to not urinate for several hours after this surgery.  If you feel uncomfortable, please contact the office for instructions.  After you are able to urinate,you may eat, if you feel like it.  Follow a light bland diet the first 24 hours after arrival home, such as soup, liquids, crackers, etc.  Be sure to include lots of fluids daily (6-8 glasses).  Avoid fast food or heavy meals, as your are more likely to get nauseated.  Eat a low fat diet the next few days after surgery.  Limit caffeine intake to 1-2 servings a day. °3. PAIN CONTROL: °a. Pain is best controlled by a usual combination of several different methods TOGETHER: °i. Muscle relaxation °1.  Soak in a warm bath (or Sitz bath) three times a day and after bowel movements.  Continue to do this until all pain is resolved. °2. Take the muscle relaxer (Valium) every 6 hours for the first 2 days after surgery  °ii. Over the counter pain medication °iii. Prescription pain medication °b. Most patients will experience some swelling and discomfort in the anus/rectal area and incisions.  Heat such as warm towels, sitz baths, warm baths, etc to help relax tight/sore spots and speed recovery.  Some people prefer to use ice, especially in the first couple days after surgery, as it may decrease the pain and swelling, or alternate between ice & heat.  Experiment to what works for you.  Swelling and bruising can take several weeks to resolve.  Pain can take even longer to completely resolve. °c. It is helpful to take an over-the-counter pain medication regularly for the first few weeks.  Choose one of the following that works best for you: °i. Naproxen (Aleve, etc)  Two 220mg tabs twice a day °ii. Ibuprofen (Advil, etc) Three 200mg tabs four  times a day (every meal & bedtime) °d. A  prescription for pain medication (such as percocet, oxycodone, hydrocodone, etc) should be given to you upon discharge.  Take your pain medication as prescribed.  °i. If you are having problems/concerns with the prescription medicine (does not control pain, nausea, vomiting, rash, itching, etc), please call us [phone] to see if we need to switch you to a different pain medicine that will work better for you and/or control your side effect better. °ii. If you need a refill on your pain medication, please contact your pharmacy.  They will contact our office to request authorization. Prescriptions will not be filled after 5 pm or on week-ends. °4. KEEP YOUR BOWELS REGULAR and AVOID CONSTIPATION °a. The goal is one to two soft bowel movements a day.  You should at least have a bowel movement every other day. °b. Avoid getting constipated.  Between the surgery and the pain medications, it is common to experience some constipation. This can be very painful after rectal surgery.  Increasing fluid intake and taking a fiber supplement (such as Metamucil, Citrucel, FiberCon, etc) 1-2 times a day regularly will usually help prevent this problem from occurring.  A stool softener like colace is also recommended.  This can be purchased over the counter at your pharmacy.  You can take it up to 3 times a day.  If you do not have a bowel movement after 24 hrs since your surgery,   take one does of milk of magnesia.  If you still haven't had a bowel movement 8-12 hours after that dose, take another dose.  If you don't have a bowel movement 48 hrs after surgery, purchase a Fleets enema from the drug store and administer gently per package instructions.  If you still are having trouble with your bowel movements after that, please call the office for further instructions. °c. If you develop diarrhea or have many loose bowel movements, simplify your diet to bland foods & liquids for a few  days.  Stop any stool softeners and decrease your fiber supplement.  Switching to mild anti-diarrheal medications (Kayopectate, Pepto Bismol) can help.  If this worsens or does not improve, please call us. ° °5. Wound Care °a. Remove your bandages before your first bowel movement or 8 hours after surgery.     °b. Remove any wound packing material at this tim,e as well.  You do not need to repack the wound unless instructed otherwise.  Wear an absorbent pad or soft cotton gauze in your underwear to catch any drainage and help keep the area clean. You should change this every 2-3 hours while awake. °c. Keep the area clean and dry.  Bathe / shower every day, especially after bowel movements.  Keep the area clean by showering / bathing over the incision / wound.   It is okay to soak an open wound to help wash it.  Wet wipes or showers / gentle washing after bowel movements is often less traumatic than regular toilet paper. °d. You may have some styrofoam-like soft packing in the rectum which will come out with the first bowel movement.  °e. You will often notice bleeding with bowel movements.  This should slow down by the end of the first week of surgery °f. Expect some drainage.  This should slow down, too, by the end of the first week of surgery.  Wear an absorbent pad or soft cotton gauze in your underwear until the drainage stops. °g. Do Not sit on a rubber or pillow ring.  This can make you symptoms worse.  You may sit on a soft pillow if needed.  °6. ACTIVITIES as tolerated:   °a. You may resume regular (light) daily activities beginning the next day--such as daily self-care, walking, climbing stairs--gradually increasing activities as tolerated.  If you can walk 30 minutes without difficulty, it is safe to try more intense activity such as jogging, treadmill, bicycling, low-impact aerobics, swimming, etc. °b. Save the most intensive and strenuous activity for last such as sit-ups, heavy lifting, contact sports,  etc  Refrain from any heavy lifting or straining until you are off narcotics for pain control.   °c. You may drive when you are no longer taking prescription pain medication, you can comfortably sit for long periods of time, and you can safely maneuver your car and apply brakes. °d. You may have sexual intercourse when it is comfortable.  °7. FOLLOW UP in our office °a. Please call [facility] at [phone] to set up an appointment to see your surgeon in the office for a follow-up appointment approximately 3-4 weeks after your surgery. °b. Make sure that you call for this appointment the day you arrive home to insure a convenient appointment time. °10. IF YOU HAVE DISABILITY OR FAMILY LEAVE FORMS, BRING THEM TO THE OFFICE FOR PROCESSING.  DO NOT GIVE THEM TO YOUR DOCTOR. ° ° ° ° °WHEN TO CALL US [phone]: °1. Poor pain control °  2. Reactions / problems with new medications (rash/itching, nausea, etc)  3. Fever over 101.5 F (38.5 C) 4. Inability to urinate 5. Nausea and/or vomiting 6. Worsening swelling or bruising 7. Continued bleeding from incision. 8. Increased pain, redness, or drainage from the incision  The clinic staff is available to answer your questions during regular business hours (8:30am-5pm).  Please don't hesitate to call and ask to speak to one of our nurses for clinical concerns.   A surgeon from [facility] is always on call at the hospitals   If you have a medical emergency, go to the nearest emergency room or call 911.    [facility] [address], [address], [place], [place]  [address] ? MAIN: [phone] ? TOLL FREE: [phone] ? FAX [phone] [email or web address]    Post Anesthesia Home Care Instructions  Activity: Get plenty of rest for the remainder of the day. A responsible individual must stay with you for 24 hours following the procedure.  For the next 24 hours, DO NOT: -Drive a car -Operate  machinery -Drink alcoholic beverages -Take any medication unless instructed by your physician -Make any legal decisions or sign important papers.  Meals: Start with liquid foods such as gelatin or soup. Progress to regular foods as tolerated. Avoid greasy, spicy, heavy foods. If nausea and/or vomiting occur, drink only clear liquids until the nausea and/or vomiting subsides. Call your physician if vomiting continues.  Special Instructions/Symptoms: Your throat may feel dry or sore from the anesthesia or the breathing tube placed in your throat during surgery. If this causes discomfort, gargle with warm salt water. The discomfort should disappear within 24 hours.  If you had a scopolamine patch placed behind your ear for the management of post- operative nausea and/or vomiting:  1. The medication in the patch is effective for 72 hours, after which it should be removed.  Wrap patch in a tissue and discard in the trash. Wash hands thoroughly with soap and water. 2. You may remove the patch earlier than 72 hours if you experience unpleasant side effects which may include dry mouth, dizziness or visual disturbances. 3. Avoid touching the patch. Wash your hands with soap and water after contact with the patch.     Information for Discharge Teaching: EXPAREL (bupivacaine liposome injectable suspension)   Your surgeon gave you EXPAREL(bupivacaine) in your surgical incision to help control your pain after surgery.   EXPAREL is a local anesthetic that provides pain relief by numbing the tissue around the surgical site.  EXPAREL is designed to release pain medication over time and can control pain for up to 72 hours.  Depending on how you respond to EXPAREL, you may require less pain medication during your recovery.  Possible side effects:  Temporary loss of sensation or ability to move in the area where bupivacaine was injected.  Nausea, vomiting, constipation  Rarely, numbness and tingling  in your mouth or lips, lightheadedness, or anxiety may occur.  Call your doctor right away if you think you may be experiencing any of these sensations, or if you have other questions regarding possible side effects.  Follow all other discharge instructions given to you by your surgeon or nurse. Eat a healthy diet and drink plenty of water or other fluids.  If you return to the hospital for any reason within 96 hours following the administration of EXPAREL, please inform your health care providers. 

## 2016-10-04 ENCOUNTER — Encounter (HOSPITAL_BASED_OUTPATIENT_CLINIC_OR_DEPARTMENT_OTHER): Payer: Self-pay | Admitting: General Surgery

## 2016-10-04 NOTE — Anesthesia Postprocedure Evaluation (Signed)
Anesthesia Post Note  Patient: [name]  Procedure(s) Performed: Procedure(s) (LRB): EXCISION OF PERIANAL SKIN TAG (N/A)     Patient location during evaluation: PACU Anesthesia Type: MAC Level of consciousness: awake and alert Pain management: pain level controlled Vital Signs Assessment: post-procedure vital signs reviewed and stable Respiratory status: spontaneous breathing, nonlabored ventilation, respiratory function stable and patient connected to nasal cannula oxygen Cardiovascular status: stable and blood pressure returned to baseline Anesthetic complications: no    Last Vitals:  Vitals:   10/01/16 1202 10/01/16 1250  BP:  118/70  Pulse: 98 90  Resp: 16 16  Temp:  36.5 C  SpO2: 99% 99%    Last Pain:  Vitals:   10/01/16 0931  TempSrc: Oral                 Tiajuana Amass

## 2017-09-07 ENCOUNTER — Ambulatory Visit: Payer: Self-pay | Admitting: Nurse Practitioner

## 2017-09-07 VITALS — BP 115/80 | HR 90 | Temp 98.3°F | Resp 16 | Wt 218.2 lb

## 2017-09-07 DIAGNOSIS — L209 Atopic dermatitis, unspecified: Secondary | ICD-10-CM

## 2017-09-07 MED ORDER — TRIAMCINOLONE ACETONIDE 0.1 % EX CREA: 1.0000 "application " | TOPICAL_CREAM | Freq: Two times a day (BID) | CUTANEOUS | 0 refills | Status: AC

## 2017-09-07 NOTE — Patient Instructions (Signed)

## 2017-09-07 NOTE — Progress Notes (Signed)
Subjective:     [name] is a 32 y.o. female who presents for evaluation of a rash involving the left/right neck and mid back. Rash started 4 weeks ago. Lesions are red and scaly, and flat in texture. Rash has changed over time. Rash causes no discomfort. Associated symptoms: none. Patient denies: abdominal pain, fever, headache, nausea, sore throat and vomiting. Patient has not had contacts with similar rash. Patient has not had new exposures (soaps, lotions, laundry detergents, foods, medications, plants, insects or animals).  States the rash on her left neck was noticed first, and then the rash appeared on her right neck.  Patient states she was at the beach about 4 weeks ago and has had the rash on her back since that time, but just really noticed it over the past week.  Patient has not applied any special soaps or lotion, just using regular lotions at this time.  Patient denies any history of asthma,eczema, or psoriasis.  She does have a history of seasonal allergies.  The following portions of the patient's history were reviewed and updated as appropriate: allergies, current medications and past medical history.  Review of Systems Constitutional: negative Eyes: negative Ears, nose, mouth, throat, and face: negative Respiratory: negative Cardiovascular: negative Integument/breast: positive for dryness, rash and skin color change, negative for pruritus and skin lesion(s) Allergic/Immunologic: positive for hay fever    Objective:    BP 115/80 (BP Location: Right Arm, Patient Position: Sitting, Cuff Size: Normal)   Pulse 90   Temp 98.3 F (36.8 C) (Oral)   Resp 16   Wt 218 lb 3.2 oz (99 kg)   SpO2 99%   BMI 32.22 kg/m  General:  alert, cooperative and no distress  Skin:  plaque noted on right/left neck and bilateral scapula, mild erythema, dryness and scaling noted to all plaques, no drainage      Assessment:    atopic dermatitis    Plan:   Exam findings, diagnosis  etiology and medication use and indications reviewed with patient. Follow- Up and discharge instructions provided. No emergent/urgent issues found on exam.  Patient verbalized understanding of information provided and agrees with plan of care (POC), all questions answered.  Atopic Dermatitis -Triamcinolone cream, apply to affected area twice daily for 14 days or until symptoms improve.  Meds ordered this encounter  Medications  . triamcinolone cream (KENALOG) 0.1 %    Sig: Apply 1 application topically 2 (two) times daily for 14 days. Or until symptoms improve.    Dispense:  80 g    Refill:  0    Order Specific Question:   Supervising Provider    Answer:   Stacie GlazeJENKINS, JOHN E [phone]]

## 2017-09-28 ENCOUNTER — Other Ambulatory Visit: Payer: Self-pay | Admitting: Nurse Practitioner

## 2019-01-11 NOTE — Progress Notes (Signed)
[name] [place], [place] [address] Phone: [phone] Subjective:   [name] [name], am serving as a scribe for Dr. Hulan Saas.   CC: Low back pain, hip pain  QTM:[id number]  [name] [name] is a 33 y.o. female coming in with complaint of low back and bilateral hip pain. R>L. Numbness and tingling in anterior right hip. Has tried massage, stretches, and muscle relaxers. Pain is intermittent. Pain increasing for past couple of days. Started walking this week. Uses IBU prn.      Past Medical History:  Diagnosis Date  . Anxiety   . 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 Pilonidal cyst 08/2011   is open and draining  . PONV (postoperative nausea and vomiting)   . Postpartum care following vaginal delivery 08/30/2014  . Postpartum care following vaginal delivery (7/8) 08/30/2014  . Seasonal allergies    Past Surgical History:  Procedure Laterality Date  . COLPOSCOPY    . EXCISION OF SKIN TAG N/A 10/01/2016   Procedure: EXCISION OF PERIANAL SKIN TAG;  Surgeon: Leighton Ruff, MD;  Location: [facility];  Service: General;  Laterality: N/A;  . PILONIDAL CYST EXCISION  09/22/2011   Procedure: CYST EXCISION PILONIDAL EXTENSIVE;  Surgeon: Joyice Faster. Cornett, MD;  Location: [facility];  Service: General;  Laterality: N/A;  . SHOULDER SURGERY  2006   right  . WISDOM TOOTH EXTRACTION     Social History   Socioeconomic History  . Marital status: Married    Spouse name: Engaged  . Number of children: 0  . Years of education: Not on file  . Highest education level: Not on file  Occupational History  . Occupation: Educational psychologist Ed Product manager: [employer]  . Financial resource strain: Not on file  . Food insecurity    Worry: Not on file    Inability: Not on file  . Transportation needs    Medical: Not on file    Non-medical: Not  on file  Tobacco Use  . Smoking status: Never Smoker  . Smokeless tobacco: Never Used  Substance and Sexual Activity  . Alcohol use: [name]  . Drug use: [name]  . Sexual activity: Yes    Birth control/protection: I.U.D.  Lifestyle  . Physical activity    Days per week: Not on file    Minutes per session: Not on file  . Stress: Not on file  Relationships  . Social Herbalist on phone: Not on file    Gets together: Not on file    Attends religious service: Not on file    Active member of club or organization: Not on file    Attends meetings of clubs or organizations: Not on file    Relationship status: Not on file  Other Topics Concern  . Not on file  Social History Narrative  . Not on file   [name] Known Allergies Family History  Problem Relation Age of Onset  . Anesthesia problems Mother        post-op N/V  . Breast cancer Mother 62  . Hypertension Father   . Congenital heart disease Father   . Heart disease Paternal Grandfather   . Lung cancer Paternal Grandfather   . Heart failure Paternal Grandfather   . Heart attack Paternal Grandfather   . Diabetes Maternal Grandmother   . Osteoporosis Maternal  Grandmother   . Heart disease Maternal Grandfather   . Heart attack Maternal Grandfather   . Diabetes Paternal Grandmother   . Rectal cancer Paternal Grandmother   . Colon cancer Neg Hx       Current Outpatient Medications (Respiratory):  .  cetirizine (ZYRTEC) 10 MG tablet, Take 10 mg by mouth at bedtime.  Current Outpatient Medications (Analgesics):  .  oxyCODONE (OXY IR/ROXICODONE) 5 MG immediate release tablet, Take 1 tablet (5 mg total) by mouth every 4 (four) hours as needed. (Patient not taking: Reported on 09/07/2017)   Current Outpatient Medications (Other):  .  sertraline (ZOLOFT) 100 MG tablet, Take 100 mg by mouth at bedtime.     Past medical history, social, surgical and family history all reviewed in electronic medical record.  [name] pertanent  information unless stated regarding to the chief complaint.   Review of Systems:  [name] headache, visual changes, nausea, vomiting, diarrhea, constipation, dizziness, abdominal pain, skin rash, fevers, chills, night sweats, weight loss, swollen lymph nodes, body aches, joint swelling, muscle aches, chest pain, shortness of breath, mood changes.   Objective  Blood pressure 110/72, pulse (!) 101, height 5\' 9"  (1.753 m), SpO2 98 %, unknown if currently breastfeeding.    General: [name] apparent distress alert and oriented x3 mood and affect normal, dressed appropriately.  HEENT: Pupils equal, extraocular movements intact  Respiratory: Patient's speak in full sentences and does not appear short of breath  Cardiovascular: [name] lower extremity edema, non tender, [name] erythema  Skin: Warm dry intact with [name] signs of infection or rash on extremities or on axial skeleton.  Abdomen: Soft nontender  Neuro: Cranial nerves II through XII are intact, neurovascularly intact in all extremities with 2+ DTRs and 2+ pulses.  Lymph: [name] lymphadenopathy of posterior or anterior cervical chain or axillae bilaterally.  Gait normal with good balance and coordination.  MSK:  tender with mild limited range of motion and good stability and symmetric strength and tone of shoulders, elbows, wrist, hip, knee and ankles bilaterally.  Patient does have poor core strength of the lower back.  Tender to palpation in the piriformis area right greater than left.  Negative straight leg test.  Full range of motion of the back noted.  Does have very mild scoliosis noted in the thoracolumbar juncture.  Osteopathic findings  C6 flexed rotated and side bent left T3 extended rotated and side bent right inhaled third rib T9 extended rotated and side bent left L2 flexed rotated and side bent right Sacrum right on right     Impression and Recommendations:     This case required medical decision making of moderate complexity. The above  documentation has been reviewed and is accurate and complete , DO       Note: This dictation was prepared with Dragon dictation along with smaller phrase technology. Any transcriptional errors that result from this process are unintentional.

## 2019-01-12 ENCOUNTER — Other Ambulatory Visit: Payer: Self-pay

## 2019-01-12 ENCOUNTER — Ambulatory Visit (INDEPENDENT_AMBULATORY_CARE_PROVIDER_SITE_OTHER): Payer: BC Managed Care – PPO | Admitting: Family Medicine

## 2019-01-12 DIAGNOSIS — M999 Biomechanical lesion, unspecified: Secondary | ICD-10-CM | POA: Insufficient documentation

## 2019-01-12 DIAGNOSIS — G5702 Lesion of sciatic nerve, left lower limb: Secondary | ICD-10-CM | POA: Diagnosis not present

## 2019-01-12 NOTE — Patient Instructions (Addendum)
Good to see you.  Ice 20 minutes 2 times daily. Usually after activity and before bed. Exercises 3 times a week.  Voltaren gel 2x a day as needed Turmeric 500mg  daily  Tart cherry extract 1200mg  at night Vitamin D 2000 IU daily  See me again in 4-6 weeks

## 2019-01-12 NOTE — Assessment & Plan Note (Signed)
Decision today to treat with OMT was based on Physical Exam  After verbal consent patient was treated with HVLA, ME, FPR techniques in cervical, thoracic, rib, lumbar and sacral areas  Patient tolerated the procedure well with improvement in symptoms  Patient given exercises, stretches and lifestyle modifications  See medications in patient instructions if given  Patient will follow up in 4-6 weeks 

## 2019-01-12 NOTE — Assessment & Plan Note (Signed)
Using Netter's Orthopaedic Anatomy, reviewed with the patient the structures involved and how they related to diagnosis. The patient indicated understanding.   The patient was given a handout about classic piriformis stretching including [employer], Modified [employer], my self-described "Sink Stretch," and other piriformis rehab.  We also reviewed hip flexor and abductor strengthening, ham stretching  Rec deep massage, explained self-massage with ball Patient also discussed over-the-counter medication.  Follow-up with me again in 6 weeks

## 2019-01-13 ENCOUNTER — Encounter: Payer: Self-pay | Admitting: Family Medicine

## 2019-02-14 ENCOUNTER — Encounter: Payer: Self-pay | Admitting: Family Medicine

## 2019-02-14 ENCOUNTER — Other Ambulatory Visit: Payer: Self-pay

## 2019-02-14 ENCOUNTER — Ambulatory Visit (INDEPENDENT_AMBULATORY_CARE_PROVIDER_SITE_OTHER): Payer: BC Managed Care – PPO | Admitting: Family Medicine

## 2019-02-14 VITALS — BP 100/80 | HR 88 | Ht 69.0 in | Wt 225.0 lb

## 2019-02-14 DIAGNOSIS — G5702 Lesion of sciatic nerve, left lower limb: Secondary | ICD-10-CM

## 2019-02-14 DIAGNOSIS — M999 Biomechanical lesion, unspecified: Secondary | ICD-10-CM | POA: Diagnosis not present

## 2019-02-14 NOTE — Assessment & Plan Note (Signed)
Is on the left side.  Discussed the possibility of injections if needed.  Patient should do relatively well overall.  Discussed which activities to do which wants to avoid.  Patient should increase activity slowly.  Follow-up again in 4 to 8 weeks

## 2019-02-14 NOTE — Patient Instructions (Signed)
See me again in 4-5 weeks Continue the vitamins

## 2019-02-14 NOTE — Progress Notes (Signed)
[facility] [address] [place], [place] [address] Phone: [phone] Subjective:   I [name] am serving as a Neurosurgeon for Dr. Antoine Primas.  I'm seeing this patient by the request  of:    CC: Low back pain follow-up  [id number]   I [name] am serving as a Neurosurgeon for Dr. Antoine Primas.  This visit occurred during the SARS-CoV-2 public health emergency.  Safety protocols were in place, including screening questions prior to the visit, additional usage of staff PPE, and extensive cleaning of exam room while observing appropriate contact time as indicated for disinfecting solutions.   [name] is a 33 y.o. female coming in with complaint of left piriformis. Patient states she is better than her first visit. Left hip pain.  Exercising previously.  Has been doing relatively well overall.  Patient states that for the first 2 weeks after manipulation felt really good but then started having increasing discomfort again.  Still feels like she is making progress.  Notices that the exercises are helpful      Past Medical History:  Diagnosis Date  . Anxiety   . 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 Pilonidal cyst 08/2011   is open and draining  . PONV (postoperative nausea and vomiting)   . Postpartum care following vaginal delivery 08/30/2014  . Postpartum care following vaginal delivery (7/8) 08/30/2014  . Seasonal allergies    Past Surgical History:  Procedure Laterality Date  . COLPOSCOPY    . EXCISION OF SKIN TAG N/A 10/01/2016   Procedure: EXCISION OF PERIANAL SKIN TAG;  Surgeon: Romie Levee, MD;  Location: [facility];  Service: General;  Laterality: N/A;  . PILONIDAL CYST EXCISION  09/22/2011   Procedure: CYST EXCISION PILONIDAL EXTENSIVE;  Surgeon: Clovis Pu. Cornett, MD;  Location: [facility];  Service: General;  Laterality: N/A;  . SHOULDER SURGERY   2006   right  . WISDOM TOOTH EXTRACTION     Social History   Socioeconomic History  . Marital status: Married    Spouse name: Engaged  . Number of children: 0  . Years of education: Not on file  . Highest education level: Not on file  Occupational History  . Occupation: Special Ed Magazine features editor: [employer]  Tobacco Use  . Smoking status: Never Smoker  . Smokeless tobacco: Never Used  Substance and Sexual Activity  . Alcohol use: No  . Drug use: No  . Sexual activity: Yes    Birth control/protection: I.U.D.  Other Topics Concern  . Not on file  Social History Narrative  . Not on file   Social Determinants of Health   Financial Resource Strain:   . Difficulty of Paying Living Expenses: Not on file  Food Insecurity:   . Worried About Programme researcher, broadcasting/film/video in the Last Year: Not on file  . Ran Out of Food in the Last Year: Not on file  Transportation Needs:   . Lack of Transportation (Medical): Not on file  . Lack of Transportation (Non-Medical): Not on file  Physical Activity:   . Days of Exercise per Week: Not on file  . Minutes of Exercise per Session: Not on file  Stress:   . Feeling of Stress : Not on file  Social Connections:   . Frequency of Communication with Friends and Family: Not on file  . Frequency of Social  Gatherings with Friends and Family: Not on file  . Attends Religious Services: Not on file  . Active Member of Clubs or Organizations: Not on file  . Attends Archivist Meetings: Not on file  . Marital Status: Not on file   No Known Allergies Family History  Problem Relation Age of Onset  . Anesthesia problems Mother        post-op N/V  . Breast cancer Mother 7  . Hypertension Father   . Congenital heart disease Father   . Heart disease Paternal Grandfather   . Lung cancer Paternal Grandfather   . Heart failure Paternal Grandfather   . Heart attack Paternal Grandfather   . Diabetes Maternal Grandmother   .  Osteoporosis Maternal Grandmother   . Heart disease Maternal Grandfather   . Heart attack Maternal Grandfather   . Diabetes Paternal Grandmother   . Rectal cancer Paternal Grandmother   . Colon cancer Neg Hx       Current Outpatient Medications (Respiratory):  .  cetirizine (ZYRTEC) 10 MG tablet, Take 10 mg by mouth at bedtime.    Current Outpatient Medications (Other):  .  sertraline (ZOLOFT) 100 MG tablet, Take 100 mg by mouth at bedtime.     Past medical history, social, surgical and family history all reviewed in electronic medical record.  No pertanent information unless stated regarding to the chief complaint.   Review of Systems:  No headache, visual changes, nausea, vomiting, diarrhea, constipation, dizziness, abdominal pain, skin rash, fevers, chills, night sweats, weight loss, swollen lymph nodes, body aches, joint swelling, muscle aches, chest pain, shortness of breath, mood changes.   Objective  Blood pressure 100/80, pulse 88, height 5\' 9"  (1.753 m), weight 225 lb (102.1 kg), SpO2 97 %, unknown if currently breastfeeding.    General: No apparent distress alert and oriented x3 mood and affect normal, dressed appropriately.  HEENT: Pupils equal, extraocular movements intact  Respiratory: Patient's speak in full sentences and does not appear short of breath  Cardiovascular: No lower extremity edema, non tender, no erythema  Skin: Warm dry intact with no signs of infection or rash on extremities or on axial skeleton.  Abdomen: Soft nontender  Neuro: Cranial nerves II through XII are intact, neurovascularly intact in all extremities with 2+ DTRs and 2+ pulses.  Lymph: No lymphadenopathy of posterior or anterior cervical chain or axillae bilaterally.  Gait normal with good balance and coordination.  MSK:  Non tender with full range of motion and good stability and symmetric strength and tone of shoulders, elbows, wrist, hip, knee and ankles bilaterally.  Back Exam:    Inspection: Unremarkable  Motion: Flexion 45 deg, Extension 45 deg, Side Bending to 45 deg bilaterally,  Rotation to 45 deg bilaterally  SLR laying: Negative tightness of the left hamstring no noted XSLR laying: Negative  Palpable tenderness: Tenderness more in the paraspinal musculature of the lumbar spine left greater than right. FABER: negative. Sensory change: Gross sensation intact to all lumbar and sacral dermatomes.  Reflexes: 2+ at both patellar tendons, 2+ at achilles tendons, Babinski's downgoing.  Strength at foot  Plantar-flexion: 5/5 Dorsi-flexion: 5/5 Eversion: 5/5 Inversion: 5/5  Leg strength  Quad: 5/5 Hamstring: 5/5 Hip flexor: 5/5 Hip abductors: 5/5  Gait unremarkable.  Osteopathic findings  C6 flexed rotated and side bent left T3 extended rotated and side bent right inhaled third rib T9 extended rotated and side bent left L2 flexed rotated and side bent right Sacrum left on left  Impression and Recommendations:     This case required medical decision making of moderate complexity. The above documentation has been reviewed and is accurate and complete Lyndal Pulley, DO       Note: This dictation was prepared with Dragon dictation along with smaller phrase technology. Any transcriptional errors that result from this process are unintentional.

## 2019-02-14 NOTE — Assessment & Plan Note (Signed)
Decision today to treat with OMT was based on Physical Exam  After verbal consent patient was treated with HVLA, ME, FPR techniques in cervical, thoracic, rib,  lumbar and sacral areas  Patient tolerated the procedure well with improvement in symptoms  Patient given exercises, stretches and lifestyle modifications  See medications in patient instructions if given  Patient will follow up in 4-8 weeks 

## 2019-03-22 ENCOUNTER — Ambulatory Visit: Payer: BC Managed Care – PPO | Admitting: Family Medicine

## 2019-03-22 NOTE — Progress Notes (Deleted)
[facility] [address] [place] Phone: [phone] Subjective:    I'm seeing this patient by the request  of:  Aloha Gell, MD  CC:   [id number]  [name] is a 34 y.o. female coming in with complaint of back pain. Patient last seen on 02/14/2019 for OMT. Patient states   Onset-  Location Duration-  Character- Aggravating factors- Reliving factors-  Therapies tried-  Severity-     Past Medical History:  Diagnosis Date  . Anxiety   . 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 Pilonidal cyst 08/2011   is open and draining  . PONV (postoperative nausea and vomiting)   . Postpartum care following vaginal delivery 08/30/2014  . Postpartum care following vaginal delivery (7/8) 08/30/2014  . Seasonal allergies    Past Surgical History:  Procedure Laterality Date  . COLPOSCOPY    . EXCISION OF SKIN TAG N/A 10/01/2016   Procedure: EXCISION OF PERIANAL SKIN TAG;  Surgeon: Leighton Ruff, MD;  Location: [facility];  Service: General;  Laterality: N/A;  . PILONIDAL CYST EXCISION  09/22/2011   Procedure: CYST EXCISION PILONIDAL EXTENSIVE;  Surgeon: Joyice Faster. Cornett, MD;  Location: [facility];  Service: General;  Laterality: N/A;  . SHOULDER SURGERY  2006   right  . WISDOM TOOTH EXTRACTION     Social History   Socioeconomic History  . Marital status: Married    Spouse name: Engaged  . Number of children: 0  . Years of education: Not on file  . Highest education level: Not on file  Occupational History  . Occupation: Special Ed Product manager: [employer]  Tobacco Use  . Smoking status: Never Smoker  . Smokeless tobacco: Never Used  Substance and Sexual Activity  . Alcohol use: No  . Drug use: No  . Sexual activity: Yes    Birth control/protection: I.U.D.  Other Topics Concern  . Not on file  Social History  Narrative  . Not on file   Social Determinants of Health   Financial Resource Strain:   . Difficulty of Paying Living Expenses: Not on file  Food Insecurity:   . Worried About Charity fundraiser in the Last Year: Not on file  . Ran Out of Food in the Last Year: Not on file  Transportation Needs:   . Lack of Transportation (Medical): Not on file  . Lack of Transportation (Non-Medical): Not on file  Physical Activity:   . Days of Exercise per Week: Not on file  . Minutes of Exercise per Session: Not on file  Stress:   . Feeling of Stress : Not on file  Social Connections:   . Frequency of Communication with Friends and Family: Not on file  . Frequency of Social Gatherings with Friends and Family: Not on file  . Attends Religious Services: Not on file  . Active Member of Clubs or Organizations: Not on file  . Attends Archivist Meetings: Not on file  . Marital Status: Not on file   No Known Allergies Family History  Problem Relation Age of Onset  . Anesthesia problems Mother        post-op N/V  . Breast cancer Mother 27  . Hypertension Father   . Congenital heart disease Father   . Heart disease Paternal Grandfather   . Lung cancer Paternal Grandfather   . Heart  failure Paternal Grandfather   . Heart attack Paternal Grandfather   . Diabetes Maternal Grandmother   . Osteoporosis Maternal Grandmother   . Heart disease Maternal Grandfather   . Heart attack Maternal Grandfather   . Diabetes Paternal Grandmother   . Rectal cancer Paternal Grandmother   . Colon cancer Neg Hx       Current Outpatient Medications (Respiratory):  .  cetirizine (ZYRTEC) 10 MG tablet, Take 10 mg by mouth at bedtime.    Current Outpatient Medications (Other):  .  sertraline (ZOLOFT) 100 MG tablet, Take 100 mg by mouth at bedtime.    Reviewed prior external information including notes and imaging from  primary care provider As well as notes that were available from care  everywhere and other healthcare systems.  Past medical history, social, surgical and family history all reviewed in electronic medical record.  No pertanent information unless stated regarding to the chief complaint.   Review of Systems:  No headache, visual changes, nausea, vomiting, diarrhea, constipation, dizziness, abdominal pain, skin rash, fevers, chills, night sweats, weight loss, swollen lymph nodes, body aches, joint swelling, chest pain, shortness of breath, mood changes. POSITIVE muscle aches  Objective  unknown if currently breastfeeding.   General: No apparent distress alert and oriented x3 mood and affect normal, dressed appropriately.  HEENT: Pupils equal, extraocular movements intact  Respiratory: Patient's speak in full sentences and does not appear short of breath  Cardiovascular: No lower extremity edema, non tender, no erythema  Skin: Warm dry intact with no signs of infection or rash on extremities or on axial skeleton.  Abdomen: Soft nontender  Neuro: Cranial nerves II through XII are intact, neurovascularly intact in all extremities with 2+ DTRs and 2+ pulses.  Lymph: No lymphadenopathy of posterior or anterior cervical chain or axillae bilaterally.  Gait normal with good balance and coordination.  MSK:  Non tender with full range of motion and good stability and symmetric strength and tone of shoulders, elbows, wrist, hip, knee and ankles bilaterally.     Impression and Recommendations:     This case required medical decision making of moderate complexity. The above documentation has been reviewed and is accurate and complete Wilford Grist       Note: This dictation was prepared with Dragon dictation along with smaller phrase technology. Any transcriptional errors that result from this process are unintentional.

## 2019-03-27 ENCOUNTER — Encounter: Payer: Self-pay | Admitting: Family Medicine

## 2019-04-07 ENCOUNTER — Ambulatory Visit: Payer: BC Managed Care – PPO

## 2021-12-08 ENCOUNTER — Ambulatory Visit (INDEPENDENT_AMBULATORY_CARE_PROVIDER_SITE_OTHER): Payer: BC Managed Care – PPO

## 2021-12-08 ENCOUNTER — Ambulatory Visit: Payer: BC Managed Care – PPO | Admitting: Podiatry

## 2021-12-08 DIAGNOSIS — Q6672 Congenital pes cavus, left foot: Secondary | ICD-10-CM | POA: Diagnosis not present

## 2021-12-08 DIAGNOSIS — Q6671 Congenital pes cavus, right foot: Secondary | ICD-10-CM

## 2021-12-08 DIAGNOSIS — M722 Plantar fascial fibromatosis: Secondary | ICD-10-CM | POA: Diagnosis not present

## 2021-12-08 MED ORDER — MELOXICAM 15 MG PO TABS: 15.0000 mg | ORAL_TABLET | Freq: Every day | ORAL | 3 refills | Status: AC

## 2021-12-08 NOTE — Patient Instructions (Signed)

## 2021-12-08 NOTE — Progress Notes (Signed)
  Subjective:  Patient ID: [name], female    DOB: Apr 21, 1985,  MRN: [id number]  Chief Complaint  Patient presents with   Foot Pain    Room 1  Pt states fell of a porch back in March 2023, she went to see Dr and she was put in a boot but she says she did not return for f/u. she is having pain in the heel of left foot, radiates to her arch    36 y.o. female presents with the above complaint. History confirmed with patient.  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  Sometimes has pain in the right side but is not nearly as severe as the left  Objective:  Physical Exam: warm, good capillary refill, no trophic changes or ulcerative lesions, normal DP and PT pulses, and normal sensory exam.  Pes cavus deformity bilateral Left Foot: point tenderness over the heel pad, no lateral ankle pain or instability no pain on peroneals   Radiographs: Multiple views x-ray of both feet: no fracture, dislocation, swelling or degenerative changes noted and pes cavus foot type Assessment:   1. Pes cavus of both feet   2. Plantar fasciitis of left foot      Plan:  Patient was evaluated and treated and all questions answered.  Discussed the etiology and treatment options for plantar fasciitis including stretching, formal physical therapy, supportive shoegears such as a running shoe or sneaker, pre fabricated orthoses, injection therapy, and oral medications. We also discussed the role of surgical treatment of this for patients who do not improve after exhausting non-surgical treatment options.   -XR reviewed with patient -Educated patient on stretching and icing of the affected limb -Injection delivered to the plantar fascia of the left foot. -Rx for meloxicam. Educated on use, risks and benefits of the medication -We discussed supportive shoe gear and inserts including custom molded and premade orthoses.,  She will return for casting for orthotics  Return in about 6 weeks  (around 01/19/2022) for recheck plantar fasciitis.

## 2021-12-10 ENCOUNTER — Ambulatory Visit (INDEPENDENT_AMBULATORY_CARE_PROVIDER_SITE_OTHER): Payer: BC Managed Care – PPO | Admitting: *Deleted

## 2021-12-10 DIAGNOSIS — Q6671 Congenital pes cavus, right foot: Secondary | ICD-10-CM | POA: Diagnosis not present

## 2021-12-10 DIAGNOSIS — Q6672 Congenital pes cavus, left foot: Secondary | ICD-10-CM

## 2021-12-10 DIAGNOSIS — M722 Plantar fascial fibromatosis: Secondary | ICD-10-CM

## 2021-12-10 NOTE — Progress Notes (Signed)
Patient presents today to be casted for custom molded orthotics. Dr. Sherryle Lis has been treating patient for pes planus/plantar fasciitis.   Impression foam cast was taken. ABN signed.  Patient info-  Shoe size: 9  Shoe style: Sneakers  Weight: 210  Insurance: [employer]   Patient will be notified once orthotics arrive in office and reappoint for fitting at that time.

## 2021-12-16 ENCOUNTER — Emergency Department (HOSPITAL_BASED_OUTPATIENT_CLINIC_OR_DEPARTMENT_OTHER): Payer: BC Managed Care – PPO

## 2021-12-16 ENCOUNTER — Other Ambulatory Visit (HOSPITAL_BASED_OUTPATIENT_CLINIC_OR_DEPARTMENT_OTHER): Payer: Self-pay

## 2021-12-16 ENCOUNTER — Encounter (HOSPITAL_BASED_OUTPATIENT_CLINIC_OR_DEPARTMENT_OTHER): Payer: Self-pay | Admitting: Emergency Medicine

## 2021-12-16 ENCOUNTER — Emergency Department (HOSPITAL_BASED_OUTPATIENT_CLINIC_OR_DEPARTMENT_OTHER)
Admission: EM | Admit: 2021-12-16 | Discharge: 2021-12-16 | Disposition: A | Discharge status: Home / Self Care | Payer: BC Managed Care – PPO | Attending: Emergency Medicine | Admitting: Emergency Medicine

## 2021-12-16 ENCOUNTER — Other Ambulatory Visit: Payer: Self-pay

## 2021-12-16 DIAGNOSIS — R319 Hematuria, unspecified: Secondary | ICD-10-CM | POA: Diagnosis present

## 2021-12-16 DIAGNOSIS — N200 Calculus of kidney: Secondary | ICD-10-CM

## 2021-12-16 DIAGNOSIS — N132 Hydronephrosis with renal and ureteral calculous obstruction: Secondary | ICD-10-CM | POA: Insufficient documentation

## 2021-12-16 DIAGNOSIS — D72829 Elevated white blood cell count, unspecified: Secondary | ICD-10-CM | POA: Diagnosis not present

## 2021-12-16 LAB — URINALYSIS, ROUTINE W REFLEX MICROSCOPIC
Bilirubin Urine: NEGATIVE
Glucose, UA: NEGATIVE mg/dL
Nitrite: NEGATIVE
Protein, ur: 30 mg/dL — AB
RBC / HPF: >50 RBC/hpf — ABNORMAL HIGH (ref 0–5)
Specific Gravity, Urine: 1.025 (ref 1.005–1.030)
pH: 6 (ref 5.0–8.0)

## 2021-12-16 LAB — CBC WITH DIFFERENTIAL/PLATELET
Abs Immature Granulocytes: 0.05 10*3/uL (ref 0.00–0.07)
Basophils Absolute: 0 10*3/uL (ref 0.0–0.1)
Basophils Relative: 0 %
Eosinophils Absolute: 0.2 10*3/uL (ref 0.0–0.5)
Eosinophils Relative: 2 %
HCT: 42.3 % (ref 36.0–46.0)
Hemoglobin: 14.2 g/dL (ref 12.0–15.0)
Immature Granulocytes: 0 %
Lymphocytes Relative: 20 %
Lymphs Abs: 2.4 10*3/uL (ref 0.7–4.0)
MCH: 29.3 pg (ref 26.0–34.0)
MCHC: 33.6 g/dL (ref 30.0–36.0)
MCV: 87.4 fL (ref 80.0–100.0)
Monocytes Absolute: 0.8 10*3/uL (ref 0.1–1.0)
Monocytes Relative: 7 %
Neutro Abs: 8.6 10*3/uL — ABNORMAL HIGH (ref 1.7–7.7)
Neutrophils Relative %: 71 %
Platelets: 246 10*3/uL (ref 150–400)
RBC: 4.84 MIL/uL (ref 3.87–5.11)
RDW: 12.7 % (ref 11.5–15.5)
WBC: 12.1 10*3/uL — ABNORMAL HIGH (ref 4.0–10.5)
nRBC: 0 % (ref 0.0–0.2)

## 2021-12-16 LAB — BASIC METABOLIC PANEL
Anion gap: 8 (ref 5–15)
BUN: 12 mg/dL (ref 6–20)
CO2: 27 mmol/L (ref 22–32)
Calcium: 9.3 mg/dL (ref 8.9–10.3)
Chloride: 100 mmol/L (ref 98–111)
Creatinine, Ser: 0.62 mg/dL (ref 0.44–1.00)
GFR, Estimated: >60 mL/min (ref >60)
Glucose, Bld: 94 mg/dL (ref 70–99)
Potassium: 3.6 mmol/L (ref 3.5–5.1)
Sodium: 135 mmol/L (ref 135–145)

## 2021-12-16 LAB — PREGNANCY, URINE: Preg Test, Ur: NEGATIVE

## 2021-12-16 MED ORDER — SODIUM CHLORIDE 0.9 % IV BOLUS
1000.0000 mL | Freq: Once | INTRAVENOUS | Status: AC
  Administered 2021-12-16: 1000 mL via INTRAVENOUS

## 2021-12-16 MED ORDER — TAMSULOSIN HCL 0.4 MG PO CAPS
0.4000 mg | ORAL_CAPSULE | Freq: Every day | ORAL | 0 refills | Status: AC
  Filled 2021-12-16: qty 30, 30d supply, fill #0

## 2021-12-16 MED ORDER — OXYCODONE-ACETAMINOPHEN 5-325 MG PO TABS
1.0000 | ORAL_TABLET | Freq: Four times a day (QID) | ORAL | 0 refills | Status: AC | PRN
  Filled 2021-12-16: qty 15, 4d supply, fill #0

## 2021-12-16 MED ORDER — ONDANSETRON HCL 4 MG/2ML IJ SOLN
4.0000 mg | Freq: Once | INTRAMUSCULAR | Status: AC
  Administered 2021-12-16: 4 mg via INTRAVENOUS
  Filled 2021-12-16: qty 2

## 2021-12-16 MED ORDER — CEPHALEXIN 500 MG PO CAPS
500.0000 mg | ORAL_CAPSULE | Freq: Four times a day (QID) | ORAL | 0 refills | Status: AC
  Filled 2021-12-16: qty 20, 5d supply, fill #0

## 2021-12-16 MED ORDER — MORPHINE SULFATE (PF) 4 MG/ML IV SOLN
4.0000 mg | Freq: Once | INTRAVENOUS | Status: AC
  Administered 2021-12-16: 4 mg via INTRAVENOUS
  Filled 2021-12-16: qty 1

## 2021-12-16 MED ORDER — ONDANSETRON 8 MG PO TBDP
8.0000 mg | ORAL_TABLET | Freq: Three times a day (TID) | ORAL | 0 refills | Status: AC | PRN
  Filled 2021-12-16: qty 18, 21d supply, fill #0

## 2021-12-16 NOTE — Discharge Instructions (Addendum)
You have a 3 mm kidney stone in your left ureter.  This should pass on its own.  Take the Flomax in the evening, this helps with the stone to the ureter.  Take the Percocet as needed for pain, take the Zofran to prevent any nausea or vomiting.  Additionally, the antibiotic should be taken 1000 mg twice daily for 5 days.  Follow-up with urology in a week.  Return to the ED if you have fevers, severe pain, vomiting, or new symptoms.

## 2021-12-16 NOTE — ED Provider Notes (Signed)
[facility] DEPT Provider Note   CSN: [id number] Arrival date & time: 12/16/21  1117     History  Chief Complaint  Patient presents with   Flank Pain    [name] is a 36 y.o. female.   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  Occasionally radiates to the left lower quadrant.  Started acutely this morning, its intermittent.  Is worse whenever she changes positions.  Associated with nausea but no vomiting.  Denies any change in bowel habits.  No vaginal discharge, dysuria,, pelvic pain.  She does endorse some painless hematuria.  No previous abdominal surgeries, denies fevers or chills.  Patient is sexually monogamous relationship, denies any history of untreated STDs.  Home Medications Prior to Admission medications   Medication Sig Start Date End Date Taking? Authorizing Provider  cetirizine (ZYRTEC) 10 MG tablet Take 10 mg by mouth at bedtime.    [provider]  HUMIRA PEN 80 MG/0.8ML PNKT Inject into the skin. 12/03/21   [provider]  meloxicam (MOBIC) 15 MG tablet Take 1 tablet (15 mg total) by mouth daily. 12/08/21   McDonald, Stephan Minister, DPM  sertraline (ZOLOFT) 100 MG tablet Take 100 mg by mouth at bedtime.     [provider]      Allergies    Patient has no known allergies.    Review of Systems   Review of Systems  Genitourinary:  Positive for flank pain.    Physical Exam Updated Vital Signs BP 117/83 (BP Location: Right Arm)   Pulse 81   Temp 98.2 F (36.8 C) (Oral)   Resp 18   Ht 5\' 10"  (1.778 m)   Wt 97.5 kg   SpO2 100%   BMI 30.85 kg/m  Physical Exam Vitals and nursing note reviewed. Exam conducted with a chaperone present.  Constitutional:      Appearance: Normal appearance.  HENT:     Head: Normocephalic and atraumatic.  Eyes:     General: No scleral icterus.       Right eye: No discharge.         Left eye: No discharge.     Extraocular Movements: Extraocular movements intact.     Pupils: Pupils are equal, round, and reactive to light.  Cardiovascular:     Rate and Rhythm: Normal rate and regular rhythm.     Pulses: Normal pulses.     Heart sounds: Normal heart sounds. No murmur heard.    No friction rub. No gallop.  Pulmonary:     Effort: Pulmonary effort is normal. No respiratory distress.     Breath sounds: Normal breath sounds.  Abdominal:     General: Abdomen is flat. Bowel sounds are normal. There is no distension.     Palpations: Abdomen is soft.     Tenderness: There is no abdominal tenderness. There is left CVA tenderness.  Skin:    General: Skin is warm and dry.     Coloration: Skin is not jaundiced.  Neurological:     Mental Status: She is alert. Mental status is at baseline.     Coordination: Coordination normal.     ED Results / Procedures / Treatments   Labs (all labs ordered are listed, but only abnormal results are displayed) Labs Reviewed  URINALYSIS, ROUTINE W REFLEX MICROSCOPIC  PREGNANCY, URINE  CBC WITH DIFFERENTIAL/PLATELET  BASIC METABOLIC PANEL  EKG None  Radiology No results found.  Procedures Procedures    Medications Ordered in ED Medications  sodium chloride 0.9 % bolus 1,000 mL (has no administration in time range)  ondansetron (ZOFRAN) injection 4 mg (has no administration in time range)  morphine (PF) 4 MG/ML injection 4 mg (has no administration in time range)    ED Course/ Medical Decision Making/ A&P Clinical Course as of 12/16/21 1301  Wed Dec 16, 2021  1300 I reevaluated the patient, she reports pain is feeling improved after meds [HS]    Clinical Course User Index [HS] Theron Arista, PA-C                           Medical Decision Making Amount and/or Complexity of Data Reviewed Labs: ordered. Radiology: ordered.  Risk Prescription drug management.   Patient presents to the emergency department due to  left flank pain.  Differential includes but not limited to nephrolithiasis, pyelonephritis, UTI, septic stone, atypical diverticulitis, Pilo, ruptured ectopic, AKI, productive uropathy.  Patient has left CVA tenderness on exam.  She is afebrile, does not meet any SIRS criteria.  Abdomen is nonperitoneal. -BP 113/74   Pulse 71   Temp 98.2 F (36.8 C) (Oral)   Resp 17   Ht 5\' 10"  (1.778 m)   Wt 97.5 kg   SpO2 100%   BMI 30.85 kg/m   Patient's husband is at bedside providing independent history.  Also viewed external medical records and reviewed patient's home medication list.  I ordered, viewed and interpreted laboratory work-up. CBC with mild leukocytosis at 12.1. BMP no AKI or gross electrolyte derangement. Urine pregnancy is negative, not an ectopic. UA with large amount of hematuria, trace leukocyturia and pyuria.  Urine culture pending.   I ordered and reviewed CT renal study.  Notable for 3 mm stone in left distal ureter without any hydronephrosis.  Agree with radiologist interpretation.  Considered admission but given patient tolerates oral intake, she is nonseptic, small stone, no hydronephrosis and has close follow-up feels outpatient I think is reasonable to cover with pain medicine, antibiotics and urology follow-up.  Return precautions discussed with patient who verbalized understanding and agreement with plan.        Final Clinical Impression(s) / ED Diagnoses Final diagnoses:  None    Rx / DC Orders ED Discharge Orders     None         , Theron Arista 12/16/21 2144    2145, MD 12/28/21 1544

## 2021-12-16 NOTE — ED Triage Notes (Signed)
Pt via pov from home with left flank pain that began this morning. Pt reports that it feels like she is in labor (she isn't). Pt denies any hx of kidney stones as well as hematuria. Pt alert & oriented, nad noted.

## 2021-12-17 LAB — URINE CULTURE: Culture: <10000 — AB

## 2022-01-13 ENCOUNTER — Ambulatory Visit: Payer: BC Managed Care – PPO | Admitting: *Deleted

## 2022-01-13 DIAGNOSIS — Q6671 Congenital pes cavus, right foot: Secondary | ICD-10-CM

## 2022-01-13 NOTE — Progress Notes (Signed)
Patient presents today to pick up custom molded foot orthotics, diagnosed with pes cavus by Dr. Lilian Kapur.   Orthotics were dispensed and fit was satisfactory. Reviewed instructions for break-in and wear. Written instructions given to patient.  Patient will follow up as needed.

## 2022-01-20 ENCOUNTER — Ambulatory Visit: Payer: BC Managed Care – PPO | Admitting: Podiatry

## 2022-01-26 ENCOUNTER — Other Ambulatory Visit: Payer: Self-pay | Admitting: General Surgery

## 2022-01-28 ENCOUNTER — Encounter: Payer: Self-pay | Admitting: General Surgery

## 2022-02-18 ENCOUNTER — Ambulatory Visit: Payer: BC Managed Care – PPO | Admitting: Podiatry

## 2022-03-17 ENCOUNTER — Ambulatory Visit: Payer: BC Managed Care – PPO | Admitting: Podiatry

## 2022-03-17 DIAGNOSIS — Q6671 Congenital pes cavus, right foot: Secondary | ICD-10-CM

## 2022-03-17 DIAGNOSIS — M722 Plantar fascial fibromatosis: Secondary | ICD-10-CM | POA: Diagnosis not present

## 2022-03-17 DIAGNOSIS — Q6672 Congenital pes cavus, left foot: Secondary | ICD-10-CM

## 2022-03-20 NOTE — Progress Notes (Signed)
  Subjective:  Patient ID: [name], female    DOB: Nov 15, 1985,  MRN: [id number]  Chief Complaint  Patient presents with   pes cavus    6 week follow up  Pes cavus of both feet and Plantar fasciitis    37 y.o. female presents with the above complaint. History confirmed with patient.  She returns for follow-up her orthotics have helped quite a bit, there is some discomfort especially in the left arch and heel  Objective:  Physical Exam: warm, good capillary refill, no trophic changes or ulcerative lesions, normal DP and PT pulses, normal sensory exam, and no tenderness to palpation of the heel no bruising no blistering Assessment:   1. Pes cavus of both feet   2. Plantar fasciitis of left foot      Plan:  Patient was evaluated and treated and all questions answered.  She will continue using her home therapy exercises for Planter fasciitis is much better the orthotics have helped quite a bit.  I inspected her orthotics today I do think that the horse shoe heel pad may be causing some discomfort, I discussed with her that we could remove this and switch to a PPT cut out she will get more time to see if this is necessary  Return if symptoms worsen or fail to improve.

## 2024-03-28 ENCOUNTER — Telehealth: Admitting: Physician Assistant

## 2024-03-28 DIAGNOSIS — B9689 Other specified bacterial agents as the cause of diseases classified elsewhere: Secondary | ICD-10-CM | POA: Diagnosis not present

## 2024-03-28 DIAGNOSIS — R051 Acute cough: Secondary | ICD-10-CM | POA: Diagnosis not present

## 2024-03-28 DIAGNOSIS — J019 Acute sinusitis, unspecified: Secondary | ICD-10-CM

## 2024-03-28 MED ORDER — AMOXICILLIN-POT CLAVULANATE 875-125 MG PO TABS: 1.0000 | ORAL_TABLET | Freq: Two times a day (BID) | ORAL | 0 refills | Status: AC

## 2024-03-28 MED ORDER — PSEUDOEPH-BROMPHEN-DM 30-2-10 MG/5ML PO SYRP: 5.0000 mL | ORAL_SOLUTION | Freq: Four times a day (QID) | ORAL | 0 refills | Status: AC | PRN

## 2024-03-28 NOTE — Patient Instructions (Signed)
 " [name], thank you for joining [name] CHRISTELLA Dickinson, PA-C for today's virtual visit.  While this provider is not your primary care provider (PCP), if your PCP is located in our provider database this encounter information will be shared with them immediately following your visit.   A [facility] MyChart account gives you access to today's visit and all your visits, tests, and labs performed at [facility]  click here if you don't have a [facility] MyChart account or go to [email or web address]  Consent: (Patient) [name] provided verbal consent for this virtual visit at the beginning of the encounter.  Current Medications:  Current Outpatient Medications:    amoxicillin -clavulanate (AUGMENTIN ) 875-125 MG tablet, Take 1 tablet by mouth 2 (two) times daily., Disp: 14 tablet, Rfl: 0   brompheniramine-pseudoephedrine-DM 30-2-10 MG/5ML syrup, Take 5 mLs by mouth 4 (four) times daily as needed., Disp: 120 mL, Rfl: 0   cephALEXin  (KEFLEX ) 500 MG capsule, Take 1 capsule (500 mg total) by mouth 4 (four) times daily., Disp: 20 capsule, Rfl: 0   cetirizine (ZYRTEC) 10 MG tablet, Take 10 mg by mouth at bedtime., Disp: , Rfl:    HUMIRA PEN 80 MG/0.8ML PNKT, Inject into the skin., Disp: , Rfl:    meloxicam  (MOBIC ) 15 MG tablet, Take 1 tablet (15 mg total) by mouth daily., Disp: 30 tablet, Rfl: 3   ondansetron  (ZOFRAN -ODT) 8 MG disintegrating tablet, Take 1 tablet (8 mg total) by mouth every 8 (eight) hours as needed for nausea or vomiting., Disp: 20 tablet, Rfl: 0   oxyCODONE -acetaminophen  (PERCOCET/ROXICET) 5-325 MG tablet, Take 1 tablet by mouth every 6 (six) hours as needed for severe pain., Disp: 15 tablet, Rfl: 0   sertraline  (ZOLOFT ) 100 MG tablet, Take 100 mg by mouth at bedtime. , Disp: , Rfl:    tamsulosin  (FLOMAX ) 0.4 MG CAPS capsule, Take 1 capsule (0.4 mg total) by mouth daily after supper., Disp: 30 capsule, Rfl: 0   Medications ordered in this encounter:  Meds  ordered this encounter  Medications   brompheniramine-pseudoephedrine-DM 30-2-10 MG/5ML syrup    Sig: Take 5 mLs by mouth 4 (four) times daily as needed.    Dispense:  120 mL    Refill:  0    Supervising Provider:   BLAISE ALEENE KIDD [[id number]]   amoxicillin -clavulanate (AUGMENTIN ) 875-125 MG tablet    Sig: Take 1 tablet by mouth 2 (two) times daily.    Dispense:  14 tablet    Refill:  0    Supervising Provider:   BLAISE ALEENE KIDD [[id number]]     *If you need refills on other medications prior to your next appointment, please contact your pharmacy*  Follow-Up: Call back or seek an in-person evaluation if the symptoms worsen or if the condition fails to improve as anticipated.     Other Instructions Sinus Infection in Adults: What to Know  A sinus infection, also called sinusitis, is when your sinuses are swollen and irritated. Sinuses are small spaces around the bones in your face. They are in many places around your face, like around your eyes and behind your nose. Mucus flows out of your sinuses. This mucus can get stuck or blocked in the sinus space if the tissue in your nose swells. This allows germs to grow and cause an infection.  The infection can be short-term, lasting up to 4 weeks. Or it can be a long-term infection that lasts longer than 12 weeks. What are the causes? A sinus infection  can be caused by: Allergies or asthma. Germs, like bacteria, viruses, or a fungus. Abnormal shapes or blockages in your nose or sinuses. Growths called polyps in your nose. Air pollutants or chemicals. What increases the risk? Having a weak immune system. This is the body's defense system. Having allergies. Smoking. What are the signs or symptoms? Thick mucus from your nose that's yellow or green. Pain or pressure around your sinuses. A cough that's worse at night. Loss of smell or taste. A sore throat. Feeling very tired. How is this diagnosed? A sinus infection is diagnosed  based on your symptoms and a physical exam. You may need tests to see if the infection is short-term or long-term. This may include: Looking inside your nose for polyps. Using a small camera with a light called an endoscope to see your sinuses. Allergy testing. Imaging tests like an MRI or CT scan. Rarely, a biopsy of your bone may be done to check for a serious fungal infection. How is this treated? Treatment depends on what caused the infection. If it's caused by a virus, your symptoms should go away in 10-14 days without treatment. If it's caused by bacteria, your health care provider may wait to see if you get better on your own. Most bacterial infections get better without antibiotics. Your provider may give you medicines, such as: Decongestants to reduce swelling. Nose spray. Saline rinses to clear thick mucus. Allergy medicines. Medicines for pain you can buy at the store. If your nose passages are very narrow or have polyps blocking the sinuses, you may need surgery. Follow these instructions at home: Medicines Take your medicines only as told. If you were given antibiotics, take them as told. Do not stop taking them even if you start to feel better. Hydrate and humidify Drink more fluids as told. Inhale steam for 10-15 minutes, 3-4 times a day or as told. You can do this in the bathroom while a hot shower is running. Rest Rest as told. Ask what things are safe for you to do at home. Ask when you can go back to work or school. Sleep with your head raised. General instructions  To help with pain, use a warm, moist cloth on your face 3-4 times a day. Use saline rinses in your nose as often as told by your provider. Wash your hands often with soap and water for at least 20 seconds. If you can't use soap and water, use hand sanitizer. Do not smoke, vape, or use nicotine or tobacco. Contact a health care provider if: You have a fever. Your symptoms get worse. You feel  confused. Get help right away if: You can't stop throwing up. You have very bad pain or swelling in your face. You have vision problems. Your neck is stiff. You have trouble breathing. These symptoms may be an emergency. Call 911 right away. Do not wait to see if the symptoms will go away. Do not drive yourself to the hospital. This information is not intended to replace advice given to you by your health care provider. Make sure you discuss any questions you have with your health care provider. Document Revised: 09/08/2023 Document Reviewed: 09/08/2023 Elsevier Patient Education  [employer].   If you have been instructed to have an in-person evaluation today at a local Urgent Care facility, please use the link below. It will take you to a list of all of our available [facility], including address, phone number and hours of operation. Please  do not delay care.  [facility]  If you or a family member do not have a primary care provider, use the link below to schedule a visit and establish care. When you choose a [facility] primary care physician or advanced practice provider, you gain a long-term partner in health. Find a Primary Care Provider  Learn more about [facility]'s in-office and virtual care options: [facility] - Get Care Now "

## 2024-03-28 NOTE — Progress Notes (Signed)
 " Virtual Visit Consent   [name], you are scheduled for a virtual visit with a  provider today. Just as with appointments in the office, your consent must be obtained to participate. Your consent will be active for this visit and any virtual visit you may have with one of our providers in the next 365 days. If you have a MyChart account, a copy of this consent can be sent to you electronically.  As this is a virtual visit, video technology does not allow for your provider to perform a traditional examination. This may limit your provider's ability to fully assess your condition. If your provider identifies any concerns that need to be evaluated in person or the need to arrange testing (such as labs, EKG, etc.), we will make arrangements to do so. Although advances in technology are sophisticated, we cannot ensure that it will always work on either your end or our end. If the connection with a video visit is poor, the visit may have to be switched to a telephone visit. With either a video or telephone visit, we are not always able to ensure that we have a secure connection.  By engaging in this virtual visit, you consent to the provision of healthcare and authorize for your insurance to be billed (if applicable) for the services provided during this visit. Depending on your insurance coverage, you may receive a charge related to this service.  I need to obtain your verbal consent now. Are you willing to proceed with your visit today? [name] has provided verbal consent on 03/28/2024 for a virtual visit (video or telephone). [name] CHRISTELLA Dickinson, PA-C  Date: 03/28/2024 9:48 AM   Virtual Visit via Video Note   I, [name], connected with  [name]  ([id number], 11-Nov-1985) on 03/28/24 at  9:45 AM EST by a video-enabled telemedicine application and verified that I am speaking with the correct person using two identifiers.  Location: Patient: Virtual Visit  Location Patient: Home Provider: Virtual Visit Location Provider: Home Office   I discussed the limitations of evaluation and management by telemedicine and the availability of in person appointments. The patient expressed understanding and agreed to proceed.    History of Present Illness: [name] is a 39 y.o. who identifies as a female who was assigned female at birth, and is being seen today for sinus congestion and cough.  HPI: URI  This is a new problem. The current episode started 1 to 4 weeks ago (3 weeks; improved and returned about 1.5 weeks ago). The problem has been waxing and waning. There has been no fever. Associated symptoms include congestion, coughing (from post nasal drainage; it is productive and thick-causing gagging), headaches, rhinorrhea, sinus pain and a sore throat (now, feels from post nasal drainage). Pertinent negatives include no diarrhea, ear pain, nausea, plugged ear sensation, vomiting or wheezing. She has tried decongestant (12-hr sudafed; Mucinex DM) for the symptoms. The treatment provided no relief.     Problems:  Patient Active Problem List   Diagnosis Date Noted   Piriformis syndrome of left side 01/12/2019   Nonallopathic lesion of sacral region 01/12/2019   Nonallopathic lesion of lumbosacral region 01/12/2019   Nonallopathic lesion of thoracic region 01/12/2019   Nonallopathic lesion of cervical region 01/12/2019   Nonallopathic lesion of rib cage 01/12/2019   Postpartum care following vaginal delivery (7/8) 08/30/2014   Postpartum state 08/30/2014   Pregnancy 08/29/2014   H/O pilonidal cyst 10/17/2012  Pilonidal disease 01/11/2012   Pilonidal cyst 09/10/2011   ACUTE FRONTAL SINUSITIS 02/24/2009   OSTEOARTHRITIS 03/30/2007   WARTS, MULTIPLE 01/24/2007   HEADACHE 11/25/2006    Allergies: Allergies[1] Medications: Current Medications[2]  Observations/Objective: Patient is well-developed, well-nourished in no acute distress.  Resting  comfortably at home.  Head is normocephalic, atraumatic.  No labored breathing.  Speech is clear and coherent with logical content.  Patient is alert and oriented at baseline.    Assessment and Plan: 1. Acute bacterial sinusitis (Primary) - amoxicillin -clavulanate (AUGMENTIN ) 875-125 MG tablet; Take 1 tablet by mouth 2 (two) times daily.  Dispense: 14 tablet; Refill: 0  2. Acute cough - brompheniramine-pseudoephedrine-DM 30-2-10 MG/5ML syrup; Take 5 mLs by mouth 4 (four) times daily as needed.  Dispense: 120 mL; Refill: 0  - Worsening symptoms that have not responded to OTC medications.  - Will give Augmentin   - Bromfed DM added for cough and congestion - Continue allergy medications.  - Steam and humidifier can help - Stay well hydrated and get plenty of rest.  - Seek in person evaluation if no symptom improvement or if symptoms worsen    Follow Up Instructions: I discussed the assessment and treatment plan with the patient. The patient was provided an opportunity to ask questions and all were answered. The patient agreed with the plan and demonstrated an understanding of the instructions.  A copy of instructions were sent to the patient via MyChart unless otherwise noted below.    The patient was advised to call back or seek an in-person evaluation if the symptoms worsen or if the condition fails to improve as anticipated.    [name] HERO Camala Talwar, PA-C     [1] No Known Allergies [2]  Current Outpatient Medications:    amoxicillin -clavulanate (AUGMENTIN ) 875-125 MG tablet, Take 1 tablet by mouth 2 (two) times daily., Disp: 14 tablet, Rfl: 0   brompheniramine-pseudoephedrine-DM 30-2-10 MG/5ML syrup, Take 5 mLs by mouth 4 (four) times daily as needed., Disp: 120 mL, Rfl: 0   cephALEXin  (KEFLEX ) 500 MG capsule, Take 1 capsule (500 mg total) by mouth 4 (four) times daily., Disp: 20 capsule, Rfl: 0   cetirizine (ZYRTEC) 10 MG tablet, Take 10 mg by mouth at bedtime., Disp: , Rfl:     HUMIRA PEN 80 MG/0.8ML PNKT, Inject into the skin., Disp: , Rfl:    meloxicam  (MOBIC ) 15 MG tablet, Take 1 tablet (15 mg total) by mouth daily., Disp: 30 tablet, Rfl: 3   ondansetron  (ZOFRAN -ODT) 8 MG disintegrating tablet, Take 1 tablet (8 mg total) by mouth every 8 (eight) hours as needed for nausea or vomiting., Disp: 20 tablet, Rfl: 0   oxyCODONE -acetaminophen  (PERCOCET/ROXICET) 5-325 MG tablet, Take 1 tablet by mouth every 6 (six) hours as needed for severe pain., Disp: 15 tablet, Rfl: 0   sertraline  (ZOLOFT ) 100 MG tablet, Take 100 mg by mouth at bedtime. , Disp: , Rfl:    tamsulosin  (FLOMAX ) 0.4 MG CAPS capsule, Take 1 capsule (0.4 mg total) by mouth daily after supper., Disp: 30 capsule, Rfl: 0  "
